# Patient Record
Sex: Male | Born: 1990 | Race: Black or African American | Hispanic: No | Smoking: Current every day smoker
Health system: Southern US, Community
[De-identification: ages and names within clinical notes are randomized; demographics above are authoritative.]

## PROBLEM LIST (undated history)

## (undated) DIAGNOSIS — R0989 Other specified symptoms and signs involving the circulatory and respiratory systems: Secondary | ICD-10-CM

## (undated) DIAGNOSIS — Q211 Atrial septal defect, unspecified: Secondary | ICD-10-CM

## (undated) DIAGNOSIS — S62629A Displaced fracture of medial phalanx of unspecified finger, initial encounter for closed fracture: Secondary | ICD-10-CM

## (undated) DIAGNOSIS — M24419 Recurrent dislocation, unspecified shoulder: Secondary | ICD-10-CM

## (undated) DIAGNOSIS — R011 Cardiac murmur, unspecified: Secondary | ICD-10-CM

## (undated) HISTORY — PX: ASD REPAIR: SHX258

---

## 2011-06-20 ENCOUNTER — Other Ambulatory Visit: Payer: Self-pay | Admitting: Orthopedic Surgery

## 2011-06-23 ENCOUNTER — Encounter (HOSPITAL_BASED_OUTPATIENT_CLINIC_OR_DEPARTMENT_OTHER): Payer: Self-pay | Admitting: *Deleted

## 2011-06-23 NOTE — Pre-Procedure Instructions (Addendum)
Pt. has a permanent lower retainer.  To come for EKG.  Echo and cardiology note req. from  [facility]. Cardiology.

## 2011-06-23 NOTE — Pre-Procedure Instructions (Signed)
To come for EKG 

## 2011-06-24 ENCOUNTER — Encounter (HOSPITAL_BASED_OUTPATIENT_CLINIC_OR_DEPARTMENT_OTHER)
Admission: RE | Admit: 2011-06-24 | Discharge: 2011-06-24 | Disposition: A | Discharge status: Home / Self Care | Payer: PRIVATE HEALTH INSURANCE | Source: Ambulatory Visit | Attending: Orthopedic Surgery | Admitting: Orthopedic Surgery

## 2011-06-24 ENCOUNTER — Other Ambulatory Visit: Payer: Self-pay

## 2011-06-24 NOTE — Pre-Procedure Instructions (Signed)
Echo and cardiology note reviewed by Dr. Chaney Malling, pt. OK for surg.

## 2011-06-24 NOTE — Pre-Procedure Instructions (Signed)
Pt. states has had no problems since ASD repair, no cardiologist, no PCP

## 2011-06-25 ENCOUNTER — Encounter (HOSPITAL_BASED_OUTPATIENT_CLINIC_OR_DEPARTMENT_OTHER): Payer: Self-pay | Admitting: Anesthesiology

## 2011-06-25 ENCOUNTER — Encounter (HOSPITAL_BASED_OUTPATIENT_CLINIC_OR_DEPARTMENT_OTHER): Payer: Self-pay | Admitting: Orthopedic Surgery

## 2011-06-25 ENCOUNTER — Encounter (HOSPITAL_BASED_OUTPATIENT_CLINIC_OR_DEPARTMENT_OTHER)
Admission: RE | Disposition: A | Discharge status: Home / Self Care | Payer: Self-pay | Source: Ambulatory Visit | Attending: Orthopedic Surgery

## 2011-06-25 ENCOUNTER — Ambulatory Visit (HOSPITAL_BASED_OUTPATIENT_CLINIC_OR_DEPARTMENT_OTHER): Payer: PRIVATE HEALTH INSURANCE | Admitting: Anesthesiology

## 2011-06-25 ENCOUNTER — Encounter (HOSPITAL_BASED_OUTPATIENT_CLINIC_OR_DEPARTMENT_OTHER): Payer: Self-pay

## 2011-06-25 ENCOUNTER — Ambulatory Visit (HOSPITAL_BASED_OUTPATIENT_CLINIC_OR_DEPARTMENT_OTHER)
Admission: RE | Admit: 2011-06-25 | Discharge: 2011-06-25 | Disposition: A | Discharge status: Home / Self Care | Payer: PRIVATE HEALTH INSURANCE | Source: Ambulatory Visit | Attending: Orthopedic Surgery | Admitting: Orthopedic Surgery

## 2011-06-25 DIAGNOSIS — X500XXA Overexertion from strenuous movement or load, initial encounter: Secondary | ICD-10-CM | POA: Insufficient documentation

## 2011-06-25 DIAGNOSIS — Y92838 Other recreation area as the place of occurrence of the external cause: Secondary | ICD-10-CM | POA: Insufficient documentation

## 2011-06-25 DIAGNOSIS — Y9362 Activity, american flag or touch football: Secondary | ICD-10-CM | POA: Insufficient documentation

## 2011-06-25 DIAGNOSIS — Z01810 Encounter for preprocedural cardiovascular examination: Secondary | ICD-10-CM | POA: Insufficient documentation

## 2011-06-25 DIAGNOSIS — Y9239 Other specified sports and athletic area as the place of occurrence of the external cause: Secondary | ICD-10-CM | POA: Insufficient documentation

## 2011-06-25 DIAGNOSIS — IMO0002 Reserved for concepts with insufficient information to code with codable children: Secondary | ICD-10-CM | POA: Insufficient documentation

## 2011-06-25 HISTORY — DX: Cardiac murmur, unspecified: R01.1

## 2011-06-25 HISTORY — DX: Other specified symptoms and signs involving the circulatory and respiratory systems: R09.89

## 2011-06-25 HISTORY — DX: Atrial septal defect: Q21.1

## 2011-06-25 HISTORY — PX: ORIF FINGER FRACTURE: SHX2122

## 2011-06-25 HISTORY — DX: Atrial septal defect, unspecified: Q21.10

## 2011-06-25 HISTORY — DX: Displaced fracture of middle phalanx of unspecified finger, initial encounter for closed fracture: S62.629A

## 2011-06-25 SURGERY — OPEN REDUCTION INTERNAL FIXATION (ORIF) METACARPAL (FINGER) FRACTURE
Anesthesia: General | Site: Finger | Laterality: Right | Wound class: Clean

## 2011-06-25 MED ORDER — FENTANYL CITRATE 0.05 MG/ML IJ SOLN: 25.0000 ug | INTRAMUSCULAR | Status: DC | PRN

## 2011-06-25 MED ORDER — FENTANYL CITRATE 0.05 MG/ML IJ SOLN
50.0000 ug | INTRAMUSCULAR | Status: DC | PRN
  Administered 2011-06-25: 100 ug via INTRAVENOUS

## 2011-06-25 MED ORDER — MIDAZOLAM HCL 2 MG/2ML IJ SOLN: 1.0000 mg | INTRAMUSCULAR | Status: DC | PRN

## 2011-06-25 MED ORDER — IBUPROFEN 200 MG PO TABS: 200.0000 mg | ORAL_TABLET | Freq: Four times a day (QID) | ORAL | Status: DC | PRN

## 2011-06-25 MED ORDER — LIDOCAINE HCL (CARDIAC) 20 MG/ML IV SOLN
INTRAVENOUS | Status: DC | PRN
  Administered 2011-06-25: 40 mg via INTRAVENOUS

## 2011-06-25 MED ORDER — MIDAZOLAM HCL 2 MG/2ML IJ SOLN
0.5000 mg | INTRAMUSCULAR | Status: DC | PRN
  Administered 2011-06-25: 2 mg via INTRAVENOUS

## 2011-06-25 MED ORDER — FENTANYL CITRATE 0.05 MG/ML IJ SOLN: 50.0000 ug | INTRAMUSCULAR | Status: DC | PRN

## 2011-06-25 MED ORDER — OXYCODONE-ACETAMINOPHEN 5-325 MG PO TABS: 1.0000 | ORAL_TABLET | ORAL | Status: AC | PRN

## 2011-06-25 MED ORDER — CHLORHEXIDINE GLUCONATE 4 % EX LIQD: 60.0000 mL | Freq: Once | CUTANEOUS | Status: DC

## 2011-06-25 MED ORDER — KETOROLAC TROMETHAMINE 30 MG/ML IJ SOLN: 15.0000 mg | Freq: Once | INTRAMUSCULAR | Status: DC | PRN

## 2011-06-25 MED ORDER — LACTATED RINGERS IV SOLN
INTRAVENOUS | Status: DC
  Administered 2011-06-25: 09:00:00 via INTRAVENOUS

## 2011-06-25 MED ORDER — ROPIVACAINE HCL 5 MG/ML IJ SOLN
INTRAMUSCULAR | Status: DC | PRN
  Administered 2011-06-25: 30 mL via EPIDURAL

## 2011-06-25 MED ORDER — PROPOFOL 10 MG/ML IV EMUL
INTRAVENOUS | Status: DC | PRN
  Administered 2011-06-25: 200 mg via INTRAVENOUS

## 2011-06-25 MED ORDER — LIDOCAINE HCL 1 % IJ SOLN
INTRAMUSCULAR | Status: DC | PRN
  Administered 2011-06-25: 1 mL via INTRADERMAL

## 2011-06-25 MED ORDER — LACTATED RINGERS IV SOLN
INTRAVENOUS | Status: DC
  Administered 2011-06-25 (×2): via INTRAVENOUS

## 2011-06-25 MED ORDER — LACTATED RINGERS IV SOLN: 500.0000 mL | INTRAVENOUS | Status: DC

## 2011-06-25 MED ORDER — ONDANSETRON HCL 4 MG/2ML IJ SOLN
INTRAMUSCULAR | Status: DC | PRN
  Administered 2011-06-25: 4 mg via INTRAVENOUS

## 2011-06-25 MED ORDER — MIDAZOLAM HCL 2 MG/2ML IJ SOLN
1.0000 mg | INTRAMUSCULAR | Status: DC | PRN
  Administered 2011-06-25: 2 mg via INTRAVENOUS

## 2011-06-25 MED ORDER — OXYMETAZOLINE HCL 0.05 % NA SOLN: 2.0000 | Freq: Once | NASAL | Status: DC

## 2011-06-25 MED ORDER — CEFAZOLIN SODIUM 1-5 GM-% IV SOLN
1.0000 g | INTRAVENOUS | Status: AC
  Administered 2011-06-25: 2 g via INTRAVENOUS

## 2011-06-25 MED ORDER — METOCLOPRAMIDE HCL 5 MG/ML IJ SOLN: 10.0000 mg | Freq: Once | INTRAMUSCULAR | Status: DC | PRN

## 2011-06-25 MED ORDER — LIDOCAINE-PRILOCAINE 2.5-2.5 % EX CREA: 1.0000 "application " | TOPICAL_CREAM | Freq: Once | CUTANEOUS | Status: DC

## 2011-06-25 MED ORDER — DEXAMETHASONE SODIUM PHOSPHATE 10 MG/ML IJ SOLN
INTRAMUSCULAR | Status: DC | PRN
  Administered 2011-06-25: 10 mg via INTRAVENOUS

## 2011-06-25 MED ORDER — CEFAZOLIN SODIUM 1-5 GM-% IV SOLN: 1.0000 g | INTRAVENOUS | Status: DC

## 2011-06-25 MED ORDER — MORPHINE SULFATE 2 MG/ML IJ SOLN: 0.0500 mg/kg | INTRAMUSCULAR | Status: DC | PRN

## 2011-06-25 SURGICAL SUPPLY — 52 items
5300428 ×2 IMPLANT
BANDAGE GAUZE ELAST BULKY 4 IN (GAUZE/BANDAGES/DRESSINGS) ×2 IMPLANT
BLADE MINI RND TIP GREEN BEAV (BLADE) ×2 IMPLANT
BLADE SURG 15 STRL LF DISP TIS (BLADE) ×1 IMPLANT
BLADE SURG 15 STRL SS (BLADE) ×1
BNDG COHESIVE 3X5 TAN STRL LF (GAUZE/BANDAGES/DRESSINGS) ×4 IMPLANT
BNDG ESMARK 4X9 LF (GAUZE/BANDAGES/DRESSINGS) ×2 IMPLANT
CHLORAPREP W/TINT 26ML (MISCELLANEOUS) ×2 IMPLANT
CLOTH BEACON ORANGE TIMEOUT ST (SAFETY) ×2 IMPLANT
CORDS BIPOLAR (ELECTRODE) ×2 IMPLANT
COVER MAYO STAND STRL (DRAPES) ×2 IMPLANT
COVER TABLE BACK 60X90 (DRAPES) ×2 IMPLANT
CUFF TOURNIQUET SINGLE 18IN (TOURNIQUET CUFF) IMPLANT
DECANTER SPIKE VIAL GLASS SM (MISCELLANEOUS) IMPLANT
DRAPE EXTREMITY T 121X128X90 (DRAPE) ×2 IMPLANT
DRAPE OEC MINIVIEW 54X84 (DRAPES) ×2 IMPLANT
DRAPE SURG 17X23 STRL (DRAPES) ×2 IMPLANT
GAUZE XEROFORM 1X8 LF (GAUZE/BANDAGES/DRESSINGS) ×2 IMPLANT
GLOVE BIO SURGEON STRL SZ 6.5 (GLOVE) IMPLANT
GLOVE BIOGEL M STRL SZ7.5 (GLOVE) ×2 IMPLANT
GLOVE BIOGEL PI IND STRL 8.5 (GLOVE) ×1 IMPLANT
GLOVE BIOGEL PI INDICATOR 8.5 (GLOVE) ×1
GLOVE INDICATOR 8.0 STRL GRN (GLOVE) ×2 IMPLANT
GLOVE SURG ORTHO 8.0 STRL STRW (GLOVE) ×2 IMPLANT
GOWN BRE IMP PREV XXLGXLNG (GOWN DISPOSABLE) ×2 IMPLANT
GOWN PREVENTION PLUS XLARGE (GOWN DISPOSABLE) IMPLANT
NEEDLE 27GAX1X1/2 (NEEDLE) IMPLANT
NS IRRIG 1000ML POUR BTL (IV SOLUTION) ×2 IMPLANT
PACK BASIN DAY SURGERY FS (CUSTOM PROCEDURE TRAY) ×2 IMPLANT
PAD CAST 3X4 CTTN HI CHSV (CAST SUPPLIES) ×1 IMPLANT
PADDING CAST ABS 4INX4YD NS (CAST SUPPLIES) ×1
PADDING CAST ABS COTTON 4X4 ST (CAST SUPPLIES) ×1 IMPLANT
PADDING CAST COTTON 3X4 STRL (CAST SUPPLIES) ×1
PADDING WEBRIL 3 STERILE (GAUZE/BANDAGES/DRESSINGS) ×2 IMPLANT
SCREW SELF TAP CORTEX 1.3 10MM (Screw) ×8 IMPLANT
SLEEVE SCD COMPRESS KNEE MED (MISCELLANEOUS) IMPLANT
SPLINT PLASTER CAST XFAST 3X15 (CAST SUPPLIES) ×11 IMPLANT
SPLINT PLASTER XTRA FASTSET 3X (CAST SUPPLIES) ×11
SPONGE GAUZE 4X4 12PLY (GAUZE/BANDAGES/DRESSINGS) ×2 IMPLANT
SPONGE GAUZE 4X4 FOR O.R. (GAUZE/BANDAGES/DRESSINGS) ×2 IMPLANT
STOCKINETTE 4X48 STRL (DRAPES) ×2 IMPLANT
SUT CHROMIC 5 0 P 3 (SUTURE) IMPLANT
SUT CHROMIC 6 0 PS 4 (SUTURE) ×2 IMPLANT
SUT MERSILENE 4 0 P 3 (SUTURE) IMPLANT
SUT VICRYL 4-0 PS2 18IN ABS (SUTURE) IMPLANT
SUT VICRYL RAPID 5 0 P 3 (SUTURE) IMPLANT
SUT VICRYL RAPIDE 4/0 PS 2 (SUTURE) ×2 IMPLANT
SYR BULB 3OZ (MISCELLANEOUS) ×2 IMPLANT
SYR CONTROL 10ML LL (SYRINGE) IMPLANT
TOWEL OR 17X24 6PK STRL BLUE (TOWEL DISPOSABLE) ×2 IMPLANT
UNDERPAD 30X30 INCONTINENT (UNDERPADS AND DIAPERS) ×2 IMPLANT
WATER STERILE IRR 1000ML POUR (IV SOLUTION) IMPLANT

## 2011-06-25 NOTE — Anesthesia Postprocedure Evaluation (Signed)
Anesthesia Post Note  Patient: Christopher [name]  Procedure(s) Performed:  OPEN REDUCTION INTERNAL FIXATION (ORIF) METACARPAL (FINGER) FRACTURE - right ring middle phalanx   Anesthesia type: General  Patient location: PACU  Post pain: Pain level controlled  Post assessment: Patient's Cardiovascular Status Stable  Last Vitals:  Filed Vitals:   06/25/11 1223  BP: 125/71  Pulse: 70  Temp: 36.5 C  Resp: 16    Post vital signs: Reviewed and stable  Level of consciousness: sedated  Complications: No apparent anesthesia complications

## 2011-06-25 NOTE — Brief Op Note (Signed)
06/25/2011  11:00 AM  PATIENT:  Christopher [name]  20 y.o. male  PRE-OPERATIVE DIAGNOSIS:  fracture middle phalanx right ring finger  POST-OPERATIVE DIAGNOSIS:  same  PROCEDURE:  Procedure(s): OPEN REDUCTION INTERNAL FIXATION (ORIF) METACARPAL (FINGER) FRACTURE  SURGEON:  Surgeon(s): Nicki Reaper, MD  PHYSICIAN ASSISTANT:   ASSISTANTS: none   ANESTHESIA:   regional and general  EBL:  Total I/O In: 1300 [I.V.:1300] Out: -   BLOOD ADMINISTERED:none  DRAINS: none   LOCAL MEDICATIONS USED:  NONE  SPECIMEN:  No Specimen  DISPOSITION OF SPECIMEN:  N/A  [name]:  YES  TOURNIQUET:   Total Tourniquet Time Documented: Upper Arm (Right) - 36 minutes  DICTATION: .Other Dictation: Dictation Number [id number]  PLAN OF CARE: Discharge to home after PACU  PATIENT DISPOSITION:  PACU - hemodynamically stable.

## 2011-06-25 NOTE — Op Note (Signed)
Dictation [id number]

## 2011-06-25 NOTE — Op Note (Signed)
NAME:  Christopher [name], Christopher [name]                    ACCOUNT NO.:  MEDICAL RECORD NO.:  [id number]  LOCATION:                                 FACILITY:  PHYSICIAN:  Cindee Salt, M.D.            DATE OF BIRTH:  DATE OF PROCEDURE:  06/25/2011 DATE OF DISCHARGE:                              OPERATIVE REPORT   PREOPERATIVE DIAGNOSIS:  Fracture middle phalanx, right ring finger.  POSTOPERATIVE DIAGNOSIS:  Fracture middle phalanx, right ring finger.  OPERATION:  Open reduction and internal fixation, right ring finger, middle phalanx.  SURGEON:  Cindee Salt, MD  ANESTHESIA:  Interscalene block with general.  PROCEDURE:  The patient was brought to the operating room where an interscalene block general anesthetic was carried out without difficulty.  He was prepped using ChloraPrep, supine position, right arm free.  A 3-minute dry time was allowed.  Time-out taken, confirming the patient and procedure.  The limb was exsanguinated with an Esmarch bandage.  Tourniquet placed high on the arm, was inflated to 250 mmHg. A midlateral incision was made over the radial aspect of the ring finger right hand, carried over the dorsal aspect of the distal phalangeal joint, carried down through subcutaneous tissue.  Bleeders were electrocauterized with bipolar.  The extensor tendon was mobilized.  An incision made in the periosteum to the radial aspect of the extensor tendon.  The fracture was noted to be comminuted, this was isolated, was not found to be significantly healed.  Granulation tissue was removed. This was reduced and pinned with 2A K-wires with anticipation of replacing these with screws.  Two 1.3 mm screws were then inserted after drilling holes through the radial cortex and the ulnar cortex after image intensification revealed that the fracture was well-aligned both AP and lateral directions.  Four 10-mm screws were then inserted, 2 prior to removal of the 2A K-wires.  Each 2A K-wire was  removed sequentially with drilling this with the 1-mm drill bit.  The x-rays confirmed anatomic position in the AP and lateral direction.  There was no angulation or overlap to the finger with full flexion/extension.  The wound was copiously irrigated with saline.  The periosteum closed with running 6-0 chromic sutures and the skin with interrupted 4-0 Vicryl rapide sutures.  Sterile compressive dressing and dorsal palmar splint to the hand was applied.  On deflation of the tourniquet, all fingers immediately pinked.  He was taken to the recovery room for observation in satisfactory condition.          ______________________________ Cindee Salt, M.D.     GK/MEDQ  D:  06/25/2011  T:  06/25/2011  Job:  [id number]

## 2011-06-25 NOTE — Transfer of Care (Signed)
Immediate Anesthesia Transfer of Care Note  Patient: Christopher [name]  Procedure(s) Performed:  OPEN REDUCTION INTERNAL FIXATION (ORIF) METACARPAL (FINGER) FRACTURE - right ring middle phalanx   Patient Location: PACU  Anesthesia Type: General and Regional  Level of Consciousness: awake, sedated and patient cooperative  Airway & Oxygen Therapy: Patient Spontanous Breathing and Patient connected to face mask oxygen  Post-op Assessment: Report given to PACU RN, Post -op Vital signs reviewed and stable and Patient moving all extremities  Post vital signs: Reviewed and stable  Complications: No apparent anesthesia complications

## 2011-06-25 NOTE — Anesthesia Procedure Notes (Addendum)
Anesthesia Regional Block:  Supraclavicular block  Pre-Anesthetic Checklist: ,, timeout performed, Correct Patient, Correct Site, Correct Laterality, Correct Procedure, Correct Position, site marked, Risks and benefits discussed,  Surgical consent,  Pre-op evaluation,  At surgeon's request and post-op pain management  Laterality: Right  Prep: chloraprep       Needles:   Needle Type: Other   (Arrow Echogenic)   Needle Length: 9cm  Needle Gauge: 21    Additional Needles:  Procedures: ultrasound guided Supraclavicular block Narrative:  Start time: 06/25/2011 10:41 AM End time: 06/25/2011 10:48 AM Injection made incrementally with aspirations every 5 mL.  Performed by: Personally  Anesthesiologist: cfrederick  Additional Notes: Ultrasound guidance used to: id relevant anatomy, confirm needle position, local anesthetic spread, avoidance of vascular puncture. Picture saved. No complications.    Supraclavicular block Procedure Name: LMA Insertion Date/Time: 06/25/2011 10:12 AM Performed by: Maudy Yonan D Pre-anesthesia Checklist: Patient identified, Emergency Drugs available, Suction available and Patient being monitored Patient Re-evaluated:Patient Re-evaluated prior to inductionOxygen Delivery Method: Circle System Utilized Preoxygenation: Pre-oxygenation with 100% oxygen Intubation Type: IV induction Ventilation: Mask ventilation without difficulty LMA: LMA with gastric port inserted LMA Size: 4.0 Number of attempts: 1 Placement Confirmation: positive ETCO2 Tube secured with: Tape Dental Injury: Teeth and Oropharynx as per pre-operative assessment

## 2011-06-25 NOTE — H&P (Signed)
Christopher [name] is a 20 year old right hand dominant male referred by Dr. Ashley Jacobs for a consultation with respect to an injury to his right ring finger. This occurred while playing flag football on 06-09-11 when he dove for a flag and ended up getting caught in the player's shorts rather than grabbing the flag. He complains of constant, moderate, aching pain. He was placed in a splint. He has swelling.  X-rays reveal a fracture of his middle phalanx, long oblique in nature up to the level of the joint.   He states that he has been taking Tylenol. He is a Consulting civil engineer at [employer]. He has no history of injuries. No history of diabetes, thyroid problems, arthritis or gout.   Repeat x-rays reveals further displacement compared with the original x-rays.  Diagnosis: Oblique fracture middle phalanx right ring finger.  We have discussed with him the possibility of continued treatment vs ORIF. He would like to have this fixed. The pre, peri and post op course are discussed along with risks and complications.  He is aware there is no guarantee with surgery, possibility of infection, recurrence, injury to arteries, nerves and tendons, incomplete relief of symptoms, nonunion and dystrophy.  This will be scheduled for ORIF ring finger as an outpatient [facility]. Christopher [name] is an 20 y.o. male.   Chief Complaint: fracture right ring finger HPI: see above  Past Medical History  Diagnosis Date  . Atrial septal defect     repaired 5 yrs. ago  . Heart murmur     ASD - s/p repair  . Runny nose     clear drainage  . Fracture of finger, middle phalanx, closed     right ring; injured 06/09/11 while playing flag football    Past Surgical History  Procedure Date  . Asd repair 5 yrs. ago    [facility]    History reviewed. No pertinent family history. Social History:  reports that he has been smoking Cigarettes.  He has smoked for the past 2 years. He has never used smokeless tobacco. He reports that he drinks alcohol.  He reports that he does not use illicit drugs.  Allergies: No Known Allergies  Medications Prior to Admission  Medication Dose Route Frequency Provider Last Rate Last Dose  . ceFAZolin (ANCEF) IVPB 1 g/50 mL premix  1 g Intravenous 60 min Pre-Op       . chlorhexidine (HIBICLENS) 4 % liquid 4 application  60 mL Topical Once       . fentaNYL (SUBLIMAZE) injection 50-100 mcg  50-100 mcg Intravenous PRN Constance Goltz, MD   100 mcg at 06/25/11 [phone]  . fentaNYL (SUBLIMAZE) injection 50-100 mcg  50-100 mcg Intravenous PRN Constance Goltz, MD      . lactated ringers infusion 500 mL  500 mL Intravenous Continuous Constance Goltz, MD      . lactated ringers infusion   Intravenous Continuous Germaine Pomfret, MD 20 mL/hr at 06/25/11 [phone]    . lactated ringers infusion   Intravenous Continuous Constance Goltz, MD      . lidocaine-prilocaine (EMLA) cream 1 application  1 application Topical Once Constance Goltz, MD      . midazolam (VERSED) injection 0.5-2 mg  0.5-2 mg Intravenous PRN Constance Goltz, MD   2 mg at 06/25/11 5284  . midazolam (VERSED) injection 1-2 mg  1-2 mg Intravenous PRN Constance Goltz, MD   2 mg at 06/25/11 0939  . midazolam (VERSED) injection  1-2 mg  1-2 mg Intravenous PRN Constance Goltz, MD      . oxymetazoline (AFRIN) 0.05 % nasal spray 2 spray  2 spray Each Nare Once Constance Goltz, MD       No current outpatient prescriptions on file as of 06/25/2011.    No results found for this or any previous visit (from the past 48 hour(s)).  No results found.  ROS: neg    Blood pressure 138/77, pulse 72, temperature 98.5 F (36.9 C), temperature source Oral, resp. rate 12, height 6\' 4"  (1.93 m), weight 97.523 kg (215 lb), SpO2 100.00%.  General appearance: alert, cooperative and appears stated age Head: Normocephalic, without obvious abnormality Neck: no adenopathy Resp: clear to auscultation  bilaterally Cardio: regular rate and rhythm, S1, S2 normal, no murmur, click, rub or gallop GI: soft, non-tender; bowel sounds normal; no masses,  no organomegaly Extremities: extremities normal, atraumatic, no cyanosis or edema Pulses: 2+ and symmetric Skin: Skin color, texture, turgor normal. No rashes or lesions Neurologic: Grossly normal Incision/Wound: na  Assessment/Plan orif rrf  Mylan Lengyel R 06/25/2011, 9:53 AM

## 2011-06-25 NOTE — Anesthesia Preprocedure Evaluation (Addendum)
Anesthesia Evaluation  Patient identified by MRN, date of birth, ID band Patient awake    Reviewed: Allergy & Precautions, H&P , NPO status , Patient's Chart, lab work & pertinent test results, reviewed documented beta [name] date and time   Airway Mallampati: II TM Distance: >3 FB Neck ROM: full    Dental No notable dental hx.    Pulmonary neg pulmonary ROS,    Pulmonary exam normal       Cardiovascular + Valvular Problems/Murmurs     Neuro/Psych Negative Neurological ROS  Negative Psych ROS   GI/Hepatic negative GI ROS, Neg liver ROS,   Endo/Other  Negative Endocrine ROS  Renal/GU negative Renal ROS  Genitourinary negative   Musculoskeletal   Abdominal   Peds  Hematology negative hematology ROS (+)   Anesthesia Other Findings See surgeon's H&P   Reproductive/Obstetrics negative OB ROS                           Anesthesia Physical Anesthesia Plan  ASA: III  Anesthesia Plan: General   Post-op Pain Management: MAC Combined w/ Regional for Post-op pain   Induction: Intravenous  Airway Management Planned: LMA  Additional Equipment:   Intra-op Plan:   Post-operative Plan: Extubation in OR  Informed Consent: I have reviewed the patients History and Physical, chart, labs and discussed the procedure including the risks, benefits and alternatives for the proposed anesthesia with the patient or authorized representative who has indicated his/her understanding and acceptance.   Dental advisory given  Plan Discussed with: CRNA and Surgeon  Anesthesia Plan Comments:      Anesthesia Quick Evaluation

## 2011-07-02 ENCOUNTER — Encounter (HOSPITAL_BASED_OUTPATIENT_CLINIC_OR_DEPARTMENT_OTHER): Payer: Self-pay | Admitting: Orthopedic Surgery

## 2011-10-29 ENCOUNTER — Emergency Department (HOSPITAL_COMMUNITY)
Admission: EM | Admit: 2011-10-29 | Discharge: 2011-10-30 | Disposition: A | Discharge status: Home / Self Care | Payer: Self-pay | Attending: Emergency Medicine | Admitting: Emergency Medicine

## 2011-10-29 ENCOUNTER — Encounter (HOSPITAL_COMMUNITY): Payer: Self-pay | Admitting: Emergency Medicine

## 2011-10-29 DIAGNOSIS — M21829 Other specified acquired deformities of unspecified upper arm: Secondary | ICD-10-CM

## 2011-10-29 DIAGNOSIS — M2180 Other specified acquired deformities of unspecified limb: Secondary | ICD-10-CM | POA: Insufficient documentation

## 2011-10-29 DIAGNOSIS — Y9362 Activity, american flag or touch football: Secondary | ICD-10-CM | POA: Insufficient documentation

## 2011-10-29 DIAGNOSIS — S43016A Anterior dislocation of unspecified humerus, initial encounter: Secondary | ICD-10-CM | POA: Insufficient documentation

## 2011-10-29 DIAGNOSIS — W19XXXA Unspecified fall, initial encounter: Secondary | ICD-10-CM | POA: Insufficient documentation

## 2011-10-29 NOTE — ED Notes (Signed)
Pt states he was playing flag football and he jumped up to catch a ball and when he came down he states he fell landing funny on his right shoulder

## 2011-10-30 ENCOUNTER — Emergency Department (HOSPITAL_COMMUNITY): Payer: Self-pay

## 2011-10-30 MED ORDER — PROPOFOL 10 MG/ML IV EMUL
INTRAVENOUS | Status: AC
  Filled 2011-10-30: qty 20

## 2011-10-30 MED ORDER — ONDANSETRON HCL 4 MG/2ML IJ SOLN
INTRAMUSCULAR | Status: AC
  Administered 2011-10-30: 4 mg
  Filled 2011-10-30: qty 2

## 2011-10-30 MED ORDER — PROPOFOL 10 MG/ML IV BOLUS
INTRAVENOUS | Status: AC | PRN
  Administered 2011-10-30: 50 mg via INTRAVENOUS
  Administered 2011-10-30 (×2): 25 mg via INTRAVENOUS

## 2011-10-30 MED ORDER — HYDROCODONE-ACETAMINOPHEN 5-500 MG PO TABS: 1.0000 | ORAL_TABLET | Freq: Four times a day (QID) | ORAL | Status: AC | PRN

## 2011-10-30 NOTE — ED Provider Notes (Signed)
History     CSN: [id number]  Arrival date & time 10/29/11  2324   First MD Initiated Contact with Patient 10/30/11 [phone]      Chief Complaint  Patient presents with  . Shoulder Injury    (Consider location/radiation/quality/duration/timing/severity/associated sxs/prior treatment) Patient is a 21 y.o. male presenting with shoulder injury. The history is provided by the patient. No language interpreter was used.  Shoulder Injury This is a new problem. The current episode started 1 to 2 hours ago. The problem occurs constantly. The problem has not changed since onset.Pertinent negatives include no chest pain, no abdominal pain, no headaches and no shortness of breath. The symptoms are aggravated by nothing. He has tried nothing for the symptoms. The treatment provided no relief.  10/10 pain sharp states it is not in the correct position since coming down on the right shoulder playing football.    Past Medical History  Diagnosis Date  . Atrial septal defect     repaired 5 yrs. ago  . Heart murmur     ASD - s/p repair  . Runny nose     clear drainage  . Fracture of finger, middle phalanx, closed     right ring; injured 06/09/11 while playing flag football    Past Surgical History  Procedure Date  . Asd repair 5 yrs. ago    [facility]  . Orif finger fracture 06/25/2011    Procedure: OPEN REDUCTION INTERNAL FIXATION (ORIF) METACARPAL (FINGER) FRACTURE;  Surgeon: Nicki Reaper, MD;  Location: [facility];  Service: Orthopedics;  Laterality: Right;  right ring middle phalanx     Family History  Problem Relation Age of Onset  . Hypertension Other     History  Substance Use Topics  . Smoking status: Current Everyday Smoker -- 2 years    Types: Cigarettes  . Smokeless tobacco: Never Used   Comment: 1 pack every 3 days  . Alcohol Use: Yes     once every 2 weeks      Review of Systems  Constitutional: Negative.   HENT: Negative.   Eyes: Negative.   Respiratory:  Negative for shortness of breath.   Cardiovascular: Negative for chest pain.  Gastrointestinal: Negative.  Negative for abdominal pain.  Genitourinary: Negative.   Musculoskeletal: Negative.   Skin: Negative.   Neurological: Negative for headaches.  Hematological: Negative.   Psychiatric/Behavioral: Negative.     Allergies  Review of patient's allergies indicates no known allergies.  Home Medications   Current Outpatient Rx  Name Route Sig Dispense Refill  . HYDROCODONE-ACETAMINOPHEN 5-500 MG PO TABS Oral Take 1 tablet by mouth every 6 (six) hours as needed for pain. 10 tablet 0    BP 153/87  Pulse 68  Temp(Src) 97.8 F (36.6 C) (Oral)  Resp 16  SpO2 100%  Physical Exam  Constitutional: He is oriented to person, place, and time. He appears well-developed and well-nourished. No distress.  HENT:  Head: Normocephalic and atraumatic.  Mouth/Throat: Oropharynx is clear and moist.  Eyes: Conjunctivae and EOM are normal. Pupils are equal, round, and reactive to light.  Neck: Normal range of motion. Neck supple.  Cardiovascular: Normal rate and regular rhythm.   Pulmonary/Chest: Effort normal and breath sounds normal.  Abdominal: Soft. Bowel sounds are normal.  Musculoskeletal: He exhibits tenderness.       Right shoulder visibly deformed no snuff box tenderness intact bicep tendon intact pronation and supination right radial pulse 2+ FROm of the right fingers cap refill  to right fingers < 2 sec  Neurological: He is alert and oriented to person, place, and time. He has normal reflexes.  Skin: Skin is warm and dry.  Psychiatric: He has a normal mood and affect.    ED Course  ORTHOPEDIC INJURY TREATMENT Date/Time: 10/30/2011 1:55 AM Performed by: Jasmine Awe Authorized by: Jasmine Awe Consent: Verbal consent obtained. Written consent obtained. Risks and benefits: risks, benefits and alternatives were discussed Consent given by: patient Patient  understanding: patient states understanding of the procedure being performed Patient consent: the patient's understanding of the procedure matches consent given Procedure consent: procedure consent matches procedure scheduled Relevant documents: relevant documents present and verified Site marked: the operative site was marked Imaging studies: imaging studies available Patient identity confirmed: verbally with patient and arm band Injury location: shoulder Location details: right shoulder Injury type: dislocation Dislocation type: anterior Hill-Sachs deformity: yes Chronicity: new Pre-procedure neurovascular assessment: neurovascularly intact Pre-procedure distal perfusion: normal Pre-procedure neurological function: normal Pre-procedure range of motion: reduced Local anesthesia used: no Patient sedated: yes Sedation type: moderate (conscious) sedation Sedatives: propofol Manipulation performed: yes Reduction method: external rotation, scapular manipulation and traction and counter traction Reduction successful: yes X-ray confirmed reduction: yes Immobilization: sling Post-procedure neurovascular assessment: post-procedure neurovascularly intact Post-procedure distal perfusion: normal Post-procedure neurological function: normal Post-procedure range of motion: normal Patient tolerance: Patient tolerated the procedure well with no immediate complications.   (including critical care time)  Labs Reviewed - No data to display Dg Shoulder Right  10/30/2011  *RADIOLOGY REPORT*  Clinical Data: Injured right shoulder while playing football, with obvious deformity.  RIGHT SHOULDER - 2+ VIEW  Comparison: None.  Findings: There is anterior dislocation of the right humeral head. No definite Hill-Sachs or osseous Bankart lesion is identified, though there is question of slight irregularity along the inferior glenoid.  The right acromioclavicular joint is unremarkable in appearance. The  visualized portions of the right lung are grossly clear.  No significant soft tissue abnormalities are characterized on radiograph.  IMPRESSION: Anterior dislocation of the right humeral head; no definite Hill- Sachs or osseous Bankart lesion seen, though there is question of slight irregularity along the inferior glenoid.  Original Report Authenticated By: Tonia Ghent, M.D.   Dg Shoulder Right Port  10/30/2011  *RADIOLOGY REPORT*  Clinical Data: Status post reduction of right shoulder dislocation.  PORTABLE RIGHT SHOULDER - 2+ VIEW  Comparison: None.  Findings: There is been successful interval reduction of the right humeral head dislocation.  An apparent Hill-Sachs deformity is suggested.  No osseous Bankart lesion is seen.  The right acromioclavicular joint is unremarkable in appearance. The visualized portions of the right lung are clear.  No significant soft tissue abnormalities are characterized on radiograph.  IMPRESSION: Successful reduction of right humeral head dislocation; apparent Hill-Sachs deformity suggested.  No osseous Bankart lesion seen.  Original Report Authenticated By: Tonia Ghent, M.D.     1. Hill Sachs deformity   2. Anterior shoulder dislocation      Conscious sedation also performed by me with respiratory and 2 nurses present MDM  Patient informed of hill sachs deformity and need to follow up with orthopedics for ongoing care.  Patient verbalizes understanding and agrees to follow up       Mickle Campton Smitty Cords, MD 10/30/11 [phone]

## 2011-10-30 NOTE — ED Notes (Signed)
Patient given discharge instructions, information, prescriptions, and diet order. Patient states that they adequately understand discharge information given and to return to ED if symptoms return or worsen.    Pt given information about taking medication and drinking together. Patient given 1 rx.

## 2011-10-30 NOTE — Discharge Instructions (Signed)

## 2012-03-24 ENCOUNTER — Emergency Department (HOSPITAL_COMMUNITY): Payer: Self-pay

## 2012-03-24 ENCOUNTER — Emergency Department (HOSPITAL_COMMUNITY)
Admission: EM | Admit: 2012-03-24 | Discharge: 2012-03-24 | Disposition: A | Discharge status: Home / Self Care | Payer: Self-pay | Attending: Emergency Medicine | Admitting: Emergency Medicine

## 2012-03-24 DIAGNOSIS — S43016A Anterior dislocation of unspecified humerus, initial encounter: Secondary | ICD-10-CM | POA: Insufficient documentation

## 2012-03-24 DIAGNOSIS — F17200 Nicotine dependence, unspecified, uncomplicated: Secondary | ICD-10-CM | POA: Insufficient documentation

## 2012-03-24 DIAGNOSIS — Y9367 Activity, basketball: Secondary | ICD-10-CM | POA: Insufficient documentation

## 2012-03-24 DIAGNOSIS — X58XXXA Exposure to other specified factors, initial encounter: Secondary | ICD-10-CM | POA: Insufficient documentation

## 2012-03-24 MED ORDER — SODIUM CHLORIDE 0.9 % IV SOLN
Freq: Once | INTRAVENOUS | Status: AC
  Administered 2012-03-24: 20:00:00 via INTRAVENOUS

## 2012-03-24 MED ORDER — FENTANYL CITRATE 0.05 MG/ML IJ SOLN
50.0000 ug | Freq: Once | INTRAMUSCULAR | Status: AC
  Administered 2012-03-24: 50 ug via INTRAVENOUS
  Filled 2012-03-24: qty 2

## 2012-03-24 MED ORDER — IBUPROFEN 600 MG PO TABS: 600.0000 mg | ORAL_TABLET | Freq: Four times a day (QID) | ORAL | Status: AC | PRN

## 2012-03-24 MED ORDER — PROPOFOL 10 MG/ML IV EMUL
INTRAVENOUS | Status: AC | PRN
  Administered 2012-03-24: 10 mL via INTRAVENOUS
  Administered 2012-03-24: 100 mL via INTRAVENOUS

## 2012-03-24 MED ORDER — PROPOFOL 10 MG/ML IV BOLUS
100.0000 mg | Freq: Once | INTRAVENOUS | Status: AC
  Filled 2012-03-24: qty 1

## 2012-03-24 NOTE — ED Notes (Signed)
Pt  tolerated procedure well.

## 2012-03-24 NOTE — ED Notes (Signed)
WGN:FA21<HY> Expected date:03/24/12<BR> Expected time: 7:32 PM<BR> Means of arrival:<BR> Comments:<BR> Dislocated shoulder

## 2012-03-24 NOTE — ED Notes (Signed)
Brought in by EMS with c/o right shoulder pain after his fall during basketball game.  Per pt, he fell backwards and landed on his right side--- noted right shoulder deformity, swelling and pain.  Pt was given Fentanyl IV en route to ED; pt's pain level on arrival=4/10.

## 2012-03-24 NOTE — ED Notes (Signed)
Ortho tech at bedside to place shoulder immobilizer on.

## 2012-03-24 NOTE — ED Provider Notes (Addendum)
History     CSN: [id number]  Arrival date & time 03/24/12  1933   First MD Initiated Contact with Patient 03/24/12 2007      Chief Complaint  Patient presents with  . Fall  . Shoulder Pain    (Consider location/radiation/quality/duration/timing/severity/associated sxs/prior treatment) HPI Christopher [name] is a 21 y.o. with PMH of prior right anterior shoulder dislocation and also had ASD repair about 6 years ago discovered on routine physical exam.  Presents today with should problem and pain, now 5/10, sharp, worse with any movement, patient has good sensation in right hand and forearm and denies numbness or tingling of the right hand or lateral deltoid.  Pt was going up for a layup playing basketball and fell backward landing onto his shoulder laterally.  Pt pain was 10/10 but received of fentanyl en route by EMS and feels much better.  Denies hitting head or LOC.  No neck pain.   Past Medical History  Diagnosis Date  . Atrial septal defect     repaired 5 yrs. ago  . Heart murmur     ASD - s/p repair  . Runny nose     clear drainage  . Fracture of finger, middle phalanx, closed     right ring; injured 06/09/11 while playing flag football    Past Surgical History  Procedure Date  . Asd repair 5 yrs. ago    [facility]  . Orif finger fracture 06/25/2011    Procedure: OPEN REDUCTION INTERNAL FIXATION (ORIF) METACARPAL (FINGER) FRACTURE;  Surgeon: Nicki Reaper, MD;  Location: [facility];  Service: Orthopedics;  Laterality: Right;  right ring middle phalanx     Family History  Problem Relation Age of Onset  . Hypertension Other     History  Substance Use Topics  . Smoking status: Current Everyday Smoker -- 2 years    Types: Cigarettes  . Smokeless tobacco: Never Used   Comment: 1 pack every 3 days  . Alcohol Use: Yes     once every 2 weeks      Review of Systems  Constitutional: Negative for fever and chills.  HENT: Negative for sore throat and trouble  swallowing.   Eyes: Negative for visual disturbance.  Respiratory: Negative for cough, choking and shortness of breath.   Cardiovascular: Negative for chest pain and palpitations.  Gastrointestinal: Negative for nausea, vomiting and abdominal pain.  Musculoskeletal: Negative for back pain.       Right shoulder pain.  Skin: Negative for wound.  Neurological: Negative for dizziness and headaches.    Allergies  Review of patient's allergies indicates no known allergies.  Home Medications  No current outpatient prescriptions on file.  BP 145/75  Pulse 77  Temp 98.4 F (36.9 C) (Oral)  Resp 16  SpO2 96%  Physical Exam  Constitutional: He is oriented to person, place, and time. He appears well-developed and well-nourished. No distress.  HENT:  Head: Normocephalic and atraumatic.  Right Ear: External ear normal.  Left Ear: External ear normal.  Mouth/Throat: Oropharynx is clear and moist. No oropharyngeal exudate.  Eyes: Conjunctivae and EOM are normal. Pupils are equal, round, and reactive to light.  Neck: Normal range of motion. Neck supple.  Cardiovascular: Normal rate, regular rhythm and intact distal pulses.   Murmur heard.      Soft systolic murmur at LSB  Pulmonary/Chest: Effort normal and breath sounds normal.  Abdominal: Soft. Bowel sounds are normal.  Musculoskeletal:  Right arm held abducted, bulge to anterior lateral pec, very tender to palpation in region of humeral head. Sensation intact to lateral deltoid.  Sensation intact distally, distal pulses 2+, strength 5/5 in hand and wrist.  Neurological: He is alert and oriented to person, place, and time.  Skin: Skin is warm and dry. He is not diaphoretic.    ED Course  Reduction of dislocation Date/Time: 03/24/2012 10:35 PM Performed by: Jones Skene Authorized by: Jones Skene Consent: Written consent obtained. Risks and benefits: risks, benefits and alternatives were discussed Consent given by:  patient Patient understanding: patient states understanding of the procedure being performed Patient consent: the patient's understanding of the procedure matches consent given Procedure consent: procedure consent matches procedure scheduled Relevant documents: relevant documents present and verified Imaging studies: imaging studies available Patient identity confirmed: verbally with patient and arm band Time out: Immediately prior to procedure a "time out" was called to verify the correct patient, procedure, equipment, support staff and site/side marked as required. Patient sedated: yes Sedatives: propofol Vitals: Vital signs were monitored during sedation. Patient tolerance: Patient tolerated the procedure well with no immediate complications. Comments: Post procedure neurovascular exam shows distal pulses, motor and sensation intact. Patient's pain relieved.  Post-reduction film confirm reduction.   (including critical care time)  Labs Reviewed - No data to display Dg Shoulder Right  03/24/2012  *RADIOLOGY REPORT*  Clinical Data: Postreduction  RIGHT SHOULDER - 2+ VIEW  Comparison: 03/24/2012  Findings: On two views, there is apparent relocation of the right humeral head with respect to the glenoid.  AC joint distance is normal.  Right lung apex is clear.  IMPRESSION: Apparent relocation of previously seen right shoulder dislocation.   Original Report Authenticated By: Harrel Lemon, M.D.    Dg Shoulder Right  03/24/2012  *RADIOLOGY REPORT*  Clinical Data: Shoulder pain status post fall.  RIGHT SHOULDER - 2+ VIEW  Comparison: 10/30/2011 radiographs.  Findings: There is recurrent anterior dislocation of the humeral head.  The humeral head is perched on the anterior glenoid with a probable resulting Hill-Sachs deformity.  No displaced fracture fragment is identified.  IMPRESSION: Recurrent anterior glenohumeral dislocation with probable associated Hill-Sachs deformity of the humeral head.    Original Report Authenticated By: Gerrianne Scale, M.D.      1. Anterior shoulder dislocation       MDM  Christopher [name] is a 21 y.o. male presenting with anterior shoulder dislocation confirmed on XR as interpreted by radiology and myself.  Preparations were made for conscious sedation with propofol for shoulder reduction - see note above.  EKG obtained per protocol: EKG: normal sinus rhythm, RBBB, non ischemic EKG.   Pt recovered uneventfully and prcedure was w/o complicaitons.  Pt to follow up with Orthopedics, Dr. Despina Hick in 2 weeks for re-evaluation.  He was given a Rx for ibuprofen and arm placed in a sling with return precautinos and shoulder care given. The patient understands and accepts the medical plan as it's been dictated and I have answered all questions. Discharge instructions concerning home care and prescriptions have been given.  The patient is STABLE and is discharged to home in good condition.         Jones Skene, MD 03/24/12 1610  Jones Skene, MD 03/24/12 2306

## 2012-03-24 NOTE — ED Notes (Signed)
EKG given to EDP, Wentz, MD. 

## 2012-10-31 ENCOUNTER — Emergency Department (HOSPITAL_COMMUNITY): Payer: Self-pay

## 2012-10-31 ENCOUNTER — Encounter (HOSPITAL_COMMUNITY): Payer: Self-pay | Admitting: Emergency Medicine

## 2012-10-31 ENCOUNTER — Emergency Department (HOSPITAL_COMMUNITY)
Admission: EM | Admit: 2012-10-31 | Discharge: 2012-11-01 | Disposition: A | Discharge status: Home / Self Care | Payer: Self-pay | Attending: Emergency Medicine | Admitting: Emergency Medicine

## 2012-10-31 DIAGNOSIS — R011 Cardiac murmur, unspecified: Secondary | ICD-10-CM | POA: Insufficient documentation

## 2012-10-31 DIAGNOSIS — Y998 Other external cause status: Secondary | ICD-10-CM | POA: Insufficient documentation

## 2012-10-31 DIAGNOSIS — Y9367 Activity, basketball: Secondary | ICD-10-CM | POA: Insufficient documentation

## 2012-10-31 DIAGNOSIS — X503XXA Overexertion from repetitive movements, initial encounter: Secondary | ICD-10-CM | POA: Insufficient documentation

## 2012-10-31 DIAGNOSIS — F17200 Nicotine dependence, unspecified, uncomplicated: Secondary | ICD-10-CM | POA: Insufficient documentation

## 2012-10-31 DIAGNOSIS — Y9239 Other specified sports and athletic area as the place of occurrence of the external cause: Secondary | ICD-10-CM | POA: Insufficient documentation

## 2012-10-31 DIAGNOSIS — M21829 Other specified acquired deformities of unspecified upper arm: Secondary | ICD-10-CM

## 2012-10-31 DIAGNOSIS — Z8774 Personal history of (corrected) congenital malformations of heart and circulatory system: Secondary | ICD-10-CM | POA: Insufficient documentation

## 2012-10-31 DIAGNOSIS — M24411 Recurrent dislocation, right shoulder: Secondary | ICD-10-CM

## 2012-10-31 DIAGNOSIS — Z8781 Personal history of (healed) traumatic fracture: Secondary | ICD-10-CM | POA: Insufficient documentation

## 2012-10-31 DIAGNOSIS — Y92838 Other recreation area as the place of occurrence of the external cause: Secondary | ICD-10-CM | POA: Insufficient documentation

## 2012-10-31 DIAGNOSIS — S43016A Anterior dislocation of unspecified humerus, initial encounter: Secondary | ICD-10-CM | POA: Insufficient documentation

## 2012-10-31 HISTORY — DX: Recurrent dislocation, unspecified shoulder: M24.419

## 2012-10-31 NOTE — ED Notes (Signed)
Pt was playing basketball and another player ripped the ball out of his hands and he feels like he dislocated his R shoulder, as he has done twice in the past.

## 2012-11-01 ENCOUNTER — Emergency Department (HOSPITAL_COMMUNITY): Payer: Self-pay

## 2012-11-01 MED ORDER — FENTANYL CITRATE 0.05 MG/ML IJ SOLN
100.0000 ug | Freq: Once | INTRAMUSCULAR | Status: AC
  Administered 2012-11-01: 100 ug via INTRAVENOUS
  Filled 2012-11-01: qty 2

## 2012-11-01 MED ORDER — ETOMIDATE 2 MG/ML IV SOLN
10.0000 mg | Freq: Once | INTRAVENOUS | Status: AC
  Administered 2012-11-01: 10 mg via INTRAVENOUS
  Filled 2012-11-01: qty 10

## 2012-11-01 MED ORDER — OXYCODONE-ACETAMINOPHEN 5-325 MG PO TABS: 2.0000 | ORAL_TABLET | ORAL | Status: AC | PRN

## 2012-11-01 MED ORDER — ETOMIDATE 2 MG/ML IV SOLN
INTRAVENOUS | Status: AC | PRN
  Administered 2012-11-01: 10 mg via INTRAVENOUS

## 2012-11-01 NOTE — ED Provider Notes (Signed)
History     CSN: [id number]  Arrival date & time 10/31/12  2318   First MD Initiated Contact with Patient 10/31/12 2332      Chief Complaint  Patient presents with  . Shoulder Dislocation     (Consider location/radiation/quality/duration/timing/severity/associated sxs/prior treatment) HPI 22 yo male presents to the ER with complaint of right shoulder pain and probable dislocation.  Pt has h/o two prior dislocations.  No prior shoulder surgeries.  Pt was playing bball when the injury occurred.  Past Medical History  Diagnosis Date  . Atrial septal defect     repaired 5 yrs. ago  . Heart murmur     ASD - s/p repair  . Runny nose     clear drainage  . Fracture of finger, middle phalanx, closed     right ring; injured 06/09/11 while playing flag football  . Shoulder dislocation, recurrent     Past Surgical History  Procedure Laterality Date  . Asd repair  5 yrs. ago    [facility]  . Orif finger fracture  06/25/2011    Procedure: OPEN REDUCTION INTERNAL FIXATION (ORIF) METACARPAL (FINGER) FRACTURE;  Surgeon: Nicki Reaper, MD;  Location: [facility];  Service: Orthopedics;  Laterality: Right;  right ring middle phalanx     Family History  Problem Relation Age of Onset  . Hypertension Other     History  Substance Use Topics  . Smoking status: Current Every Day Smoker -- 2 years    Types: Cigarettes  . Smokeless tobacco: Never Used     Comment: 1 pack every 3 days  . Alcohol Use: Yes     Comment: once every 2 weeks      Review of Systems  All other systems reviewed and are negative.    Allergies  Review of patient's allergies indicates no known allergies.  Home Medications   Current Outpatient Rx  Name  Route  Sig  Dispense  Refill  . diphenhydrAMINE (BENADRYL) 25 MG tablet   Oral   Take 25 mg by mouth every 6 (six) hours as needed for itching or allergies.         Marland Kitchen ibuprofen (ADVIL,MOTRIN) 200 MG tablet   Oral   Take 200 mg by mouth every 6  (six) hours as needed for pain.         Marland Kitchen oxyCODONE-acetaminophen (PERCOCET/ROXICET) 5-325 MG per tablet   Oral   Take 2 tablets by mouth every 4 (four) hours as needed for pain.   20 tablet   0     BP 128/85  Pulse 65  Temp(Src) 97.9 F (36.6 C) (Oral)  Resp 15  SpO2 100%  Physical Exam  Nursing note and vitals reviewed. Constitutional: He is oriented to person, place, and time. He appears well-developed and well-nourished.  HENT:  Head: Normocephalic and atraumatic.  Right Ear: External ear normal.  Left Ear: External ear normal.  Nose: Nose normal.  Mouth/Throat: Oropharynx is clear and moist.  Eyes: Conjunctivae and EOM are normal. Pupils are equal, round, and reactive to light.  Neck: Normal range of motion. Neck supple. No JVD present. No tracheal deviation present. No thyromegaly present.  Cardiovascular: Normal rate, regular rhythm, normal heart sounds and intact distal pulses.  Exam reveals no gallop and no friction rub.   No murmur heard. Pulmonary/Chest: Effort normal and breath sounds normal. No stridor. No respiratory distress. He has no wheezes. He has no rales. He exhibits no tenderness.  Abdominal: Soft. Bowel sounds  are normal. He exhibits no distension and no mass. There is no tenderness. There is no rebound and no guarding.  Musculoskeletal: Normal range of motion. He exhibits tenderness. He exhibits no edema.  Right shoulder with deformity c/w dislocation.  Neurovascularly intact distally.  No loss of sensation over deltoid.  Lymphadenopathy:    He has no cervical adenopathy.  Neurological: He is alert and oriented to person, place, and time. He exhibits normal muscle tone. Coordination normal.  Skin: Skin is warm and dry. No rash noted. No erythema. No pallor.  Psychiatric: He has a normal mood and affect. His behavior is normal. Judgment and thought content normal.    ED Course  Procedural sedation Date/Time: 11/01/2012 12:23 AM Performed by: Olivia Mackie Authorized by: Olivia Mackie Consent: Verbal consent obtained. written consent obtained. Risks and benefits: risks, benefits and alternatives were discussed Consent given by: patient Patient understanding: patient states understanding of the procedure being performed Patient consent: the patient's understanding of the procedure matches consent given Procedure consent: procedure consent matches procedure scheduled Relevant documents: relevant documents present and verified Test results: test results available and properly labeled Imaging studies: imaging studies available Required items: required blood products, implants, devices, and special equipment available Patient identity confirmed: verbally with patient and arm band Time out: Immediately prior to procedure a "time out" was called to verify the correct patient, procedure, equipment, support staff and site/side marked as required. Preparation: Patient was prepped and draped in the usual sterile fashion. Local anesthesia used: no Patient sedated: yes Sedatives: etomidate Analgesia: fentanyl Sedation start date/time: 11/01/2012 12:23 AM Sedation end date/time: 11/01/2012 12:28 AM Vitals: Vital signs were monitored during sedation. Patient tolerance: Patient tolerated the procedure well with no immediate complications. Comments: Pt with right anterior dislocation.  Using external rotation and abduction shoulder was easily reduced.  Pt tolerated procedure well, woke from sedation giggling.  Procedural sedation Date/Time: 11/01/2012 12:23 AM Performed by: Olivia Mackie Authorized by: Olivia Mackie Consent: Verbal consent obtained. written consent obtained. Risks and benefits: risks, benefits and alternatives were discussed Consent given by: patient Patient understanding: patient states understanding of the procedure being performed Patient consent: the patient's understanding of the procedure matches consent given Procedure  consent: procedure consent matches procedure scheduled Relevant documents: relevant documents present and verified Test results: test results available and properly labeled Imaging studies: imaging studies available Required items: required blood products, implants, devices, and special equipment available Patient identity confirmed: verbally with patient Time out: Immediately prior to procedure a "time out" was called to verify the correct patient, procedure, equipment, support staff and site/side marked as required. Preparation: Patient was prepped and draped in the usual sterile fashion. Local anesthesia used: no Patient sedated: yes Sedatives: etomidate Analgesia: fentanyl Sedation start date/time: 11/01/2012 12:23 AM Sedation end date/time: 11/01/2012 12:28 AM Vitals: Vital signs were monitored during sedation. Patient tolerance: Patient tolerated the procedure well with no immediate complications.   (including critical care time)  Labs Reviewed - No data to display Dg Shoulder Right  10/31/2012  *RADIOLOGY REPORT*  Clinical Data: Dislocation  RIGHT SHOULDER - 2+ VIEW  Comparison: None.  Findings: There is complete anterior glenohumeral dislocation without fracture.  IMPRESSION: Anterior glenohumeral dislocation.   Original Report Authenticated By: Jolaine Click, M.D.    Dg Shoulder Right Port  11/01/2012  *RADIOLOGY REPORT*  Clinical Data: Postreduction right shoulder.  PORTABLE RIGHT SHOULDER - 2+ VIEW  Comparison: 10/31/2012  Findings: Interval relocation of the right shoulder  postreduction. The glenohumeral joint appears located.  There is angulation of the lateral aspect of the humeral head consistent with Hill-Sachs fracture.  Acromioclavicular and coracoclavicular spaces are intact.  IMPRESSION: Interval relocation of the right shoulder with Hill-Sachs fracture demonstrated.   Original Report Authenticated By: Burman Nieves, M.D.      1. Shoulder dislocation, recurrent, right    2. Hill Sachs deformity       MDM  22 yo male with right shoulder dislocation.  Reduced under conscious sedation easily.  Pt instructed to f/u with orthopedics.        Olivia Mackie, MD 11/01/12 [phone]

## 2012-11-01 NOTE — ED Notes (Signed)
Shoulder replaced with no difficulty. Pt beginning to wake back up.

## 2013-10-28 IMAGING — CR DG SHOULDER 2+V*R*
2 series · 2 of 2 positions shown · non-contrast
Comparison: 03/24/2012

CLINICAL DATA: Postreduction

RIGHT SHOULDER - 2+ VIEW

[x shoulder ap right (1 of 2)]
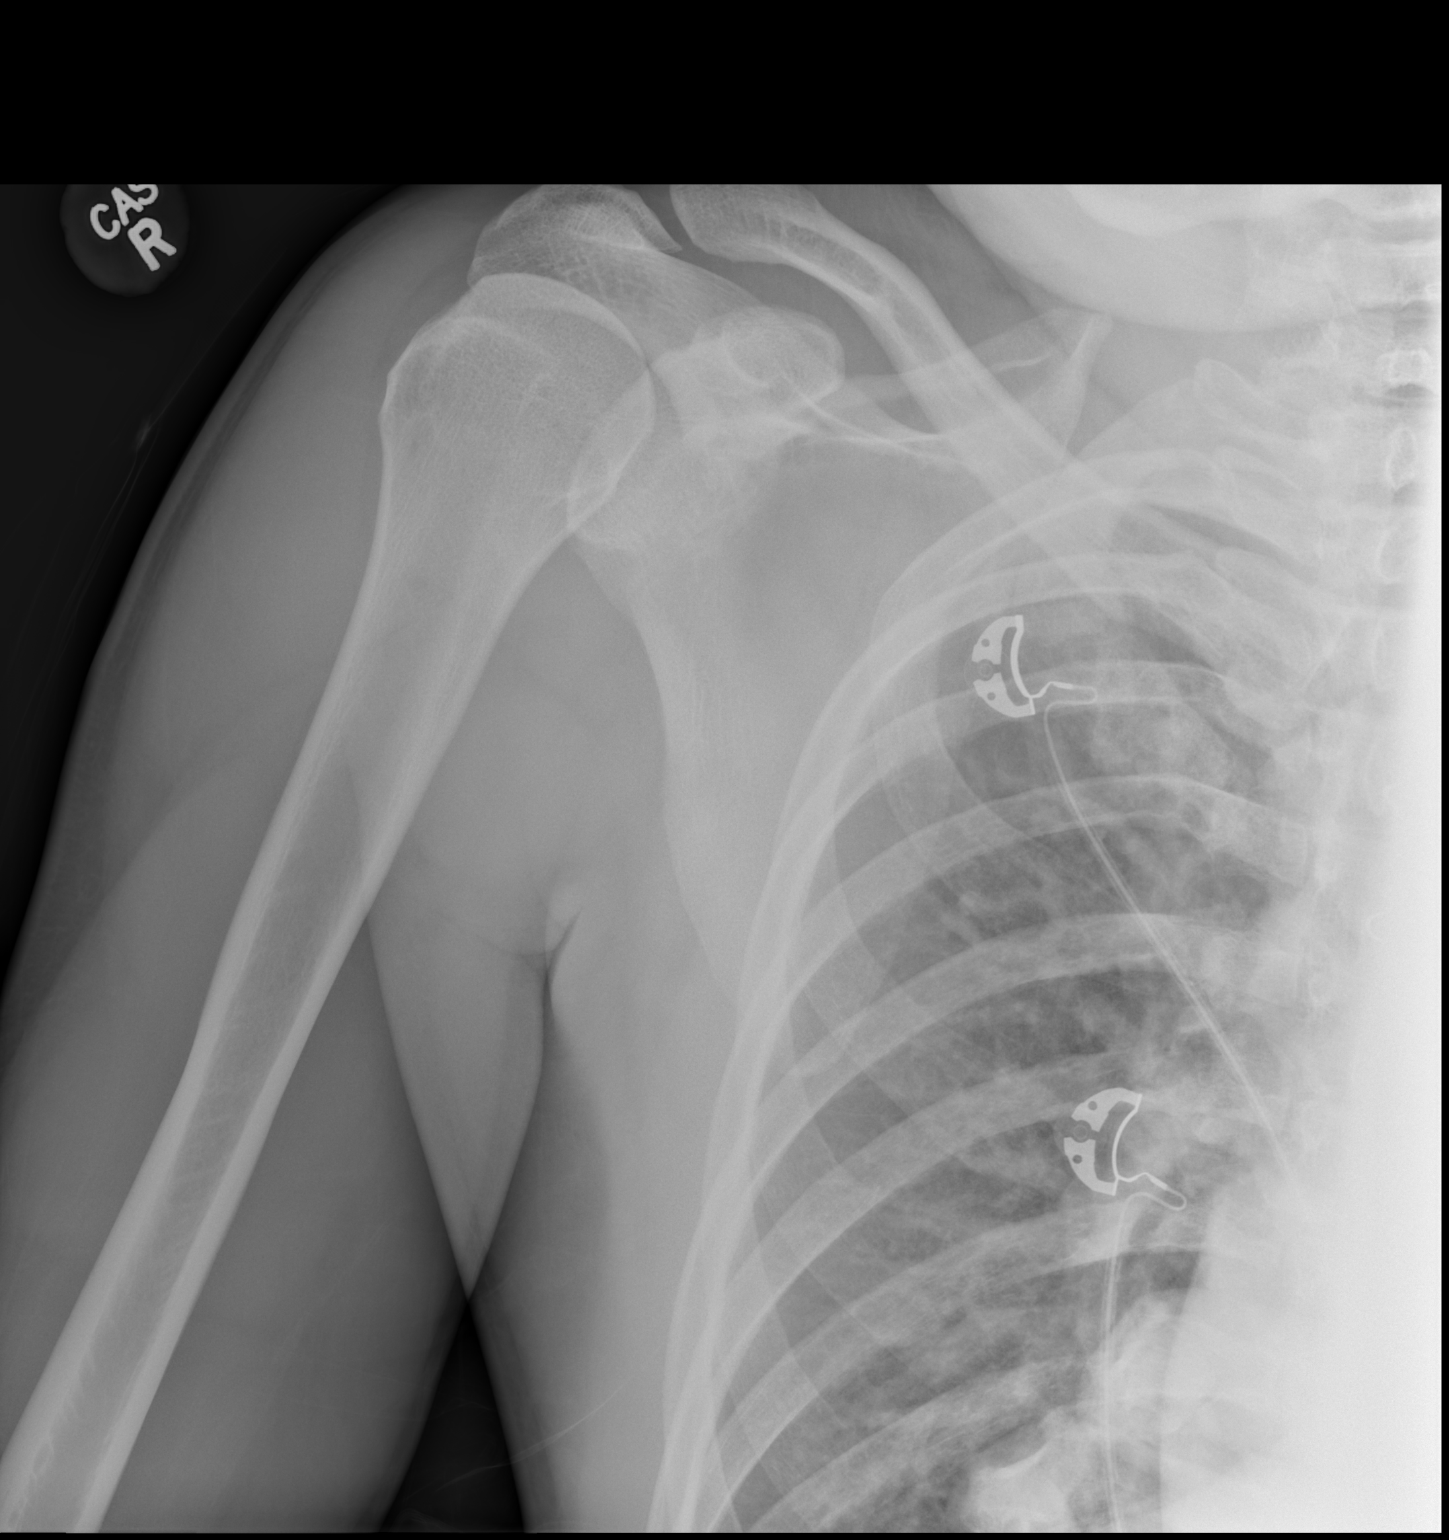

[x shoulder ap right (2 of 2)]
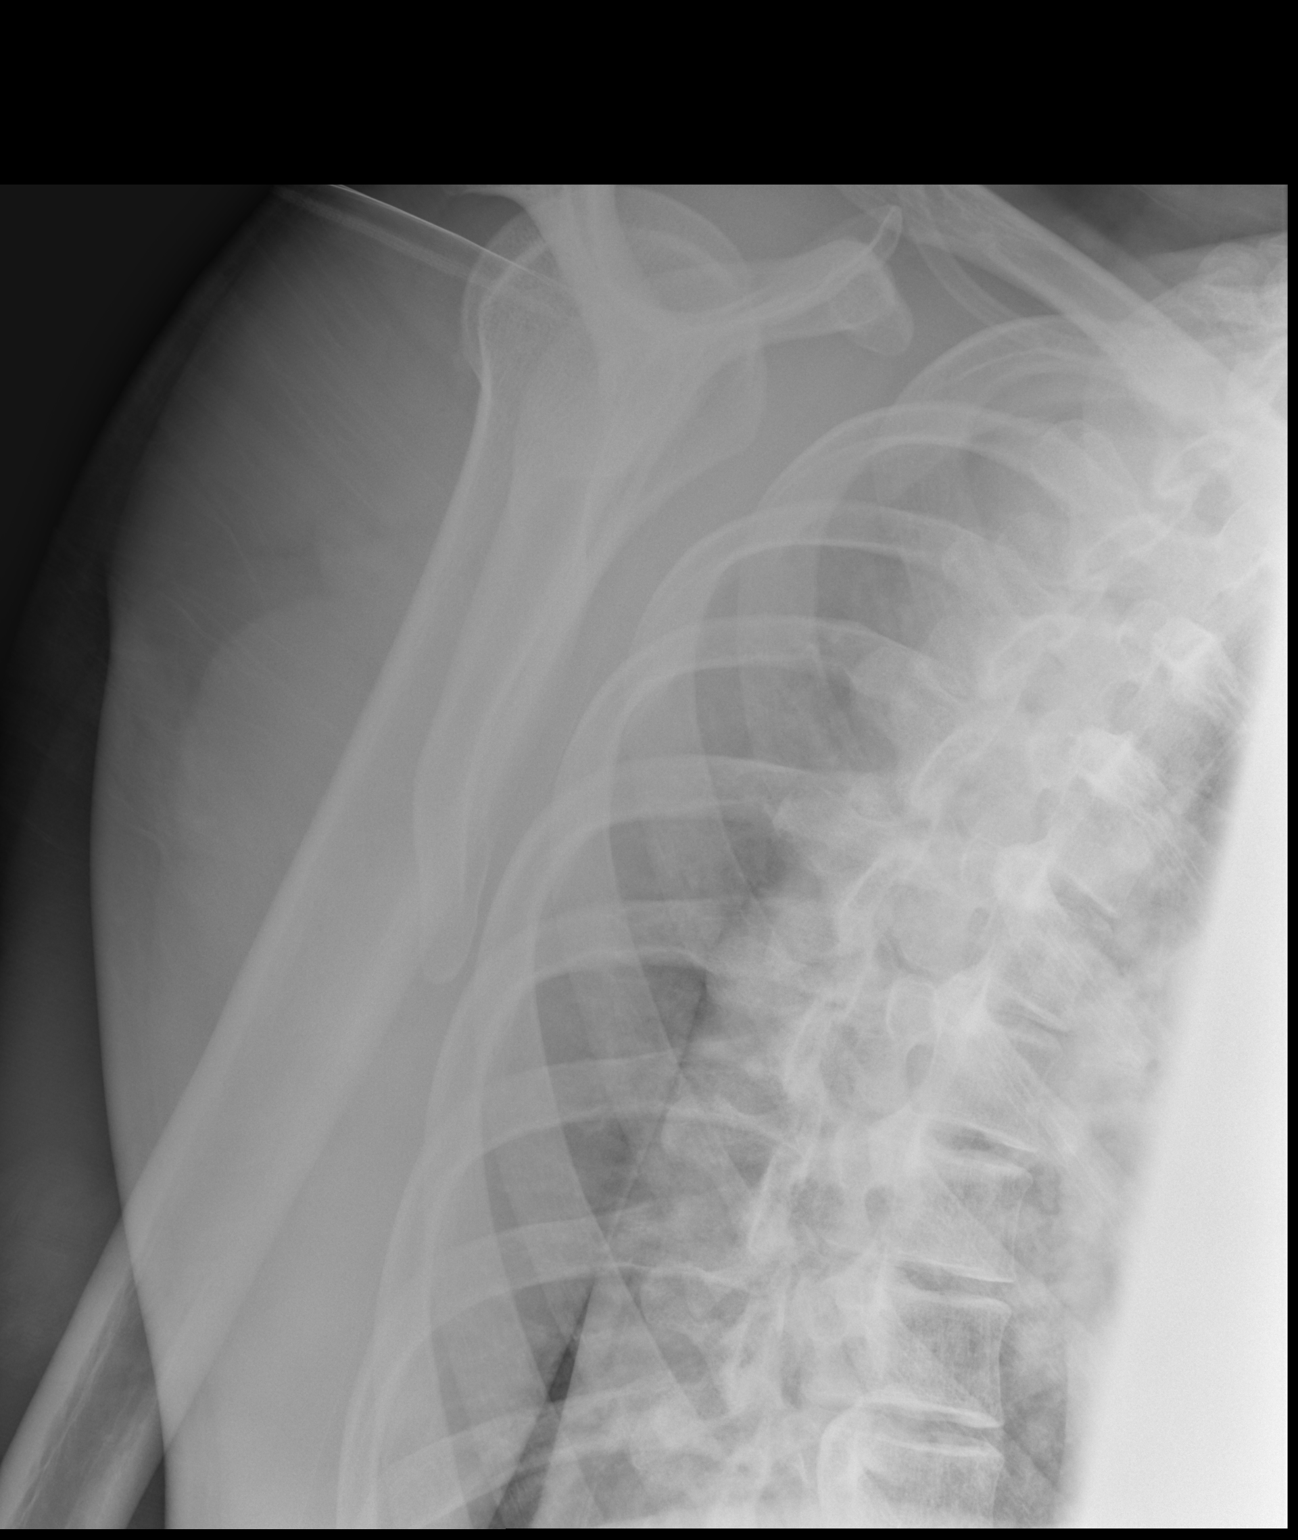

[2 of 2 positions shown; findings below may reference images not displayed]

FINDINGS: On two views, there is apparent relocation of the right
humeral head with respect to the glenoid.  AC joint distance is
normal.  Right lung apex is clear.
IMPRESSION: Apparent relocation of previously seen right shoulder dislocation.

## 2014-06-06 IMAGING — CR DG SHOULDER 2+V*R*
2 series · 2 of 2 positions shown · non-contrast
Comparison: None.

CLINICAL DATA: Dislocation

RIGHT SHOULDER - 2+ VIEW

[x shoulder ap right (1 of 2)]
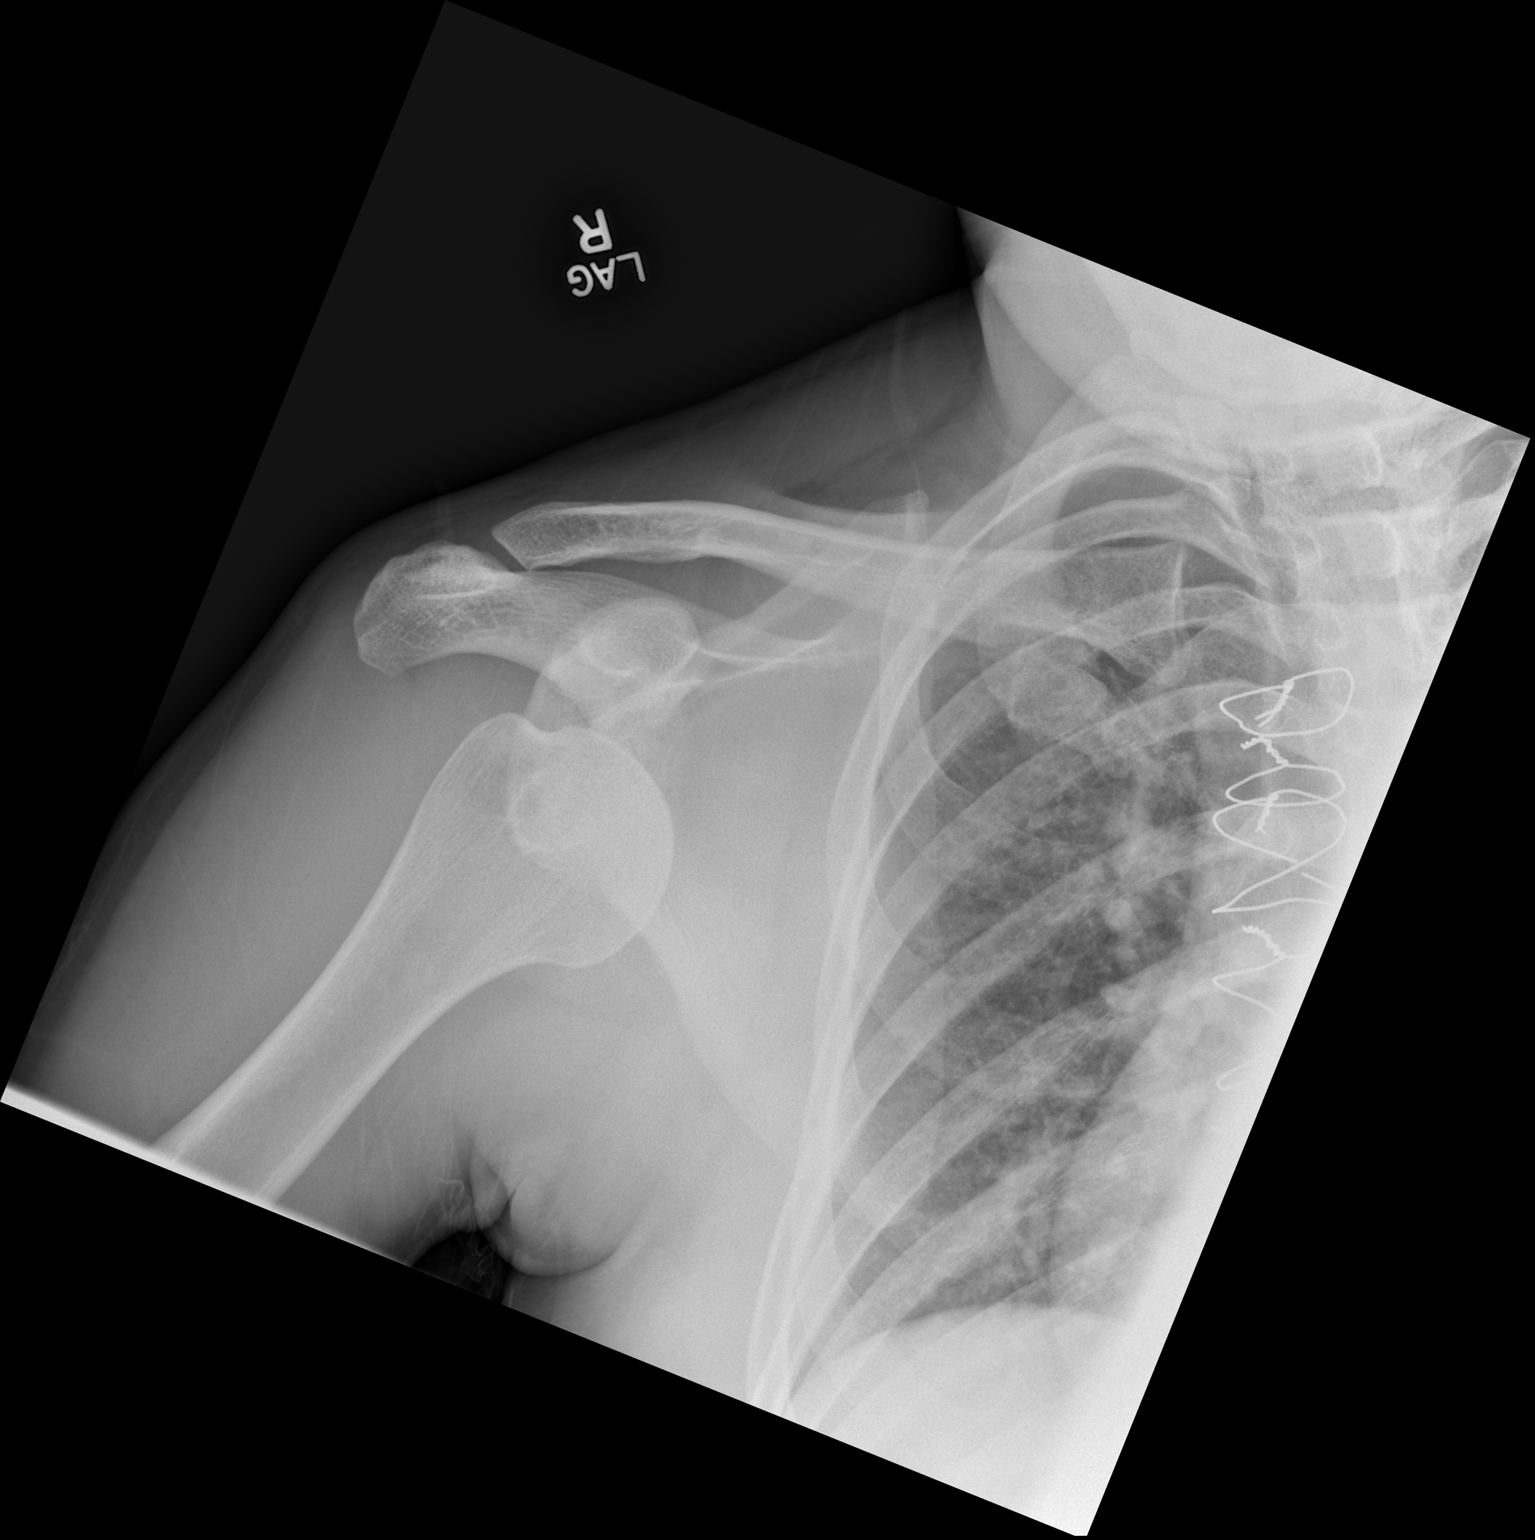

[x shoulder ap right (2 of 2)]
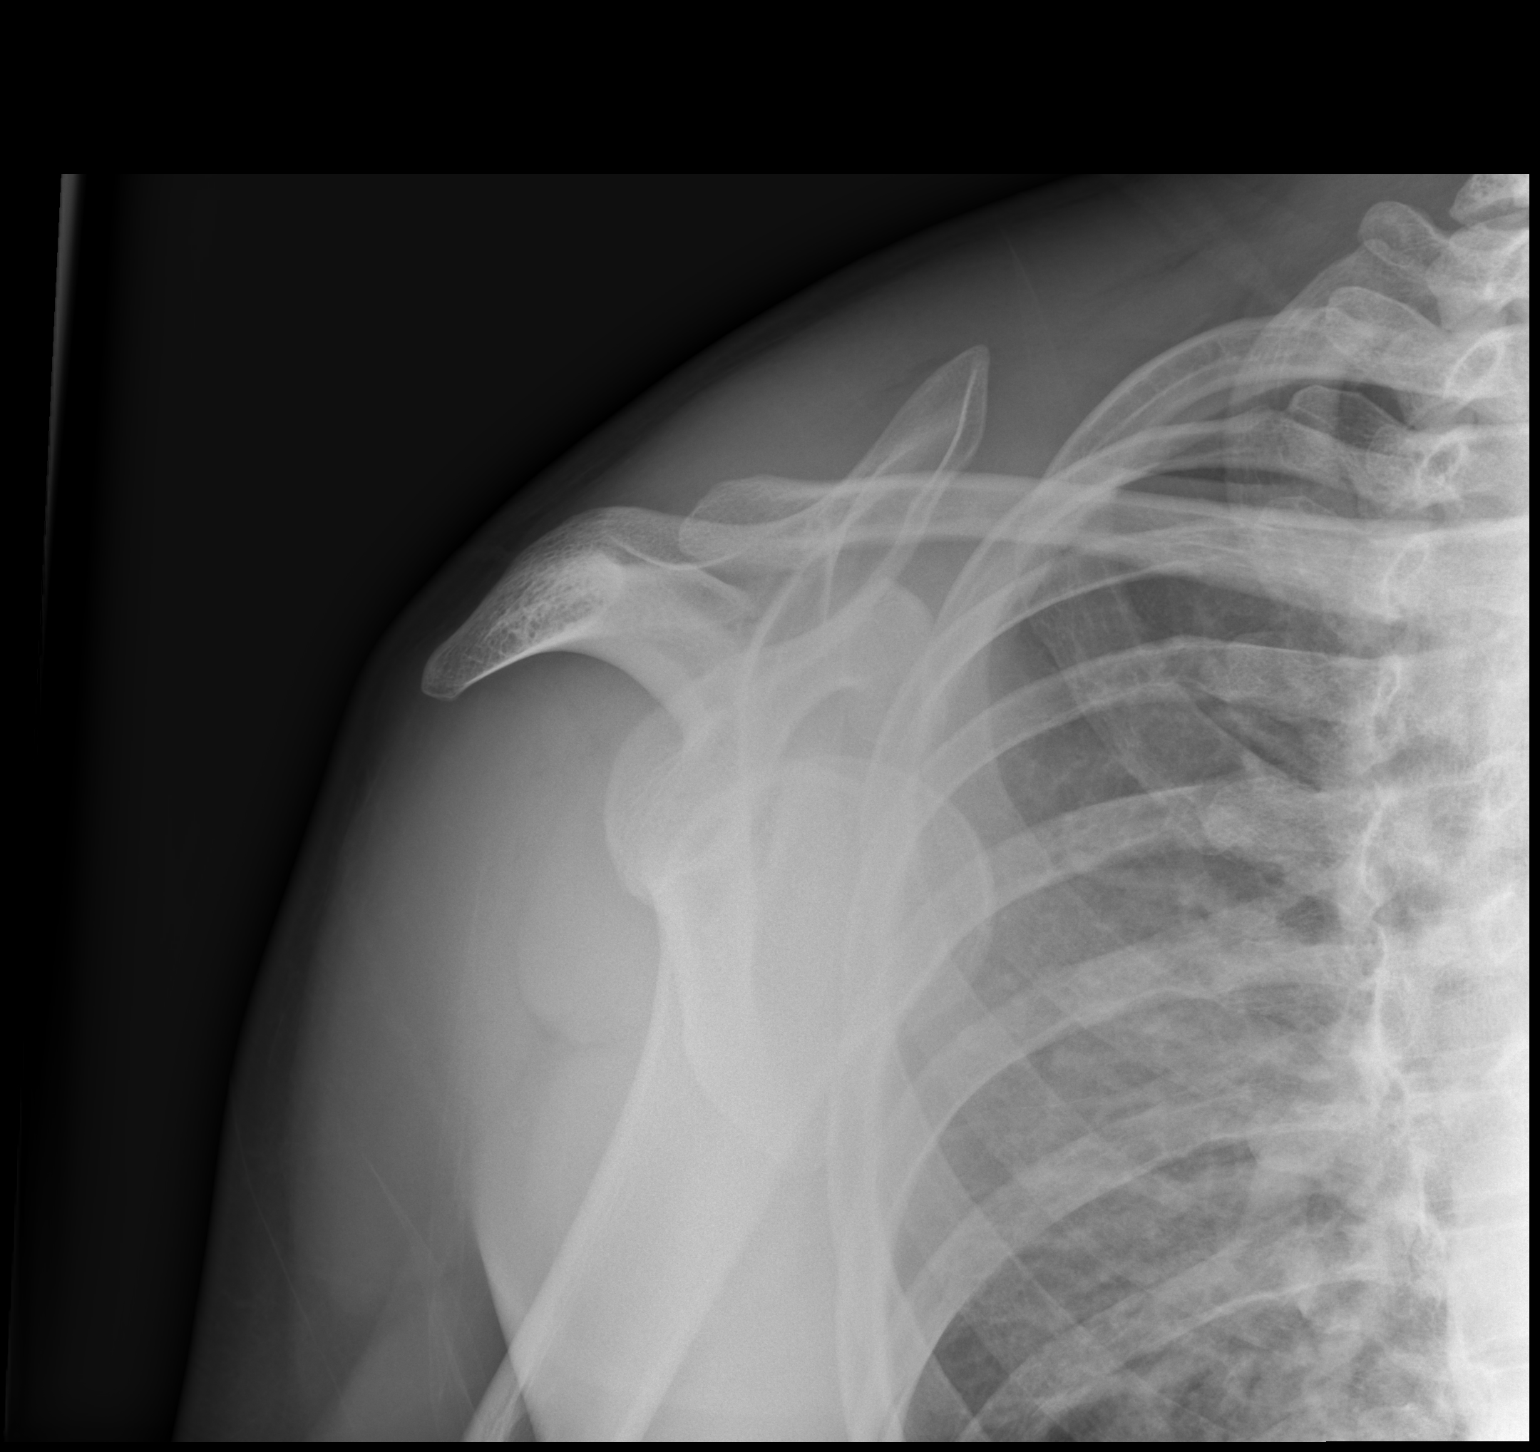

[2 of 2 positions shown; findings below may reference images not displayed]

FINDINGS: There is complete anterior glenohumeral dislocation
without fracture.
IMPRESSION: Anterior glenohumeral dislocation.

## 2014-06-07 IMAGING — CR DG SHOULDER 2+V PORT*R*
1 series · 2 of 2 positions shown · non-contrast
Comparison: 10/31/2012

CLINICAL DATA: Postreduction right shoulder.

PORTABLE RIGHT SHOULDER - 2+ VIEW

[Series 1: AP · U · 2 of 2 slices shown]
[im 1/2]
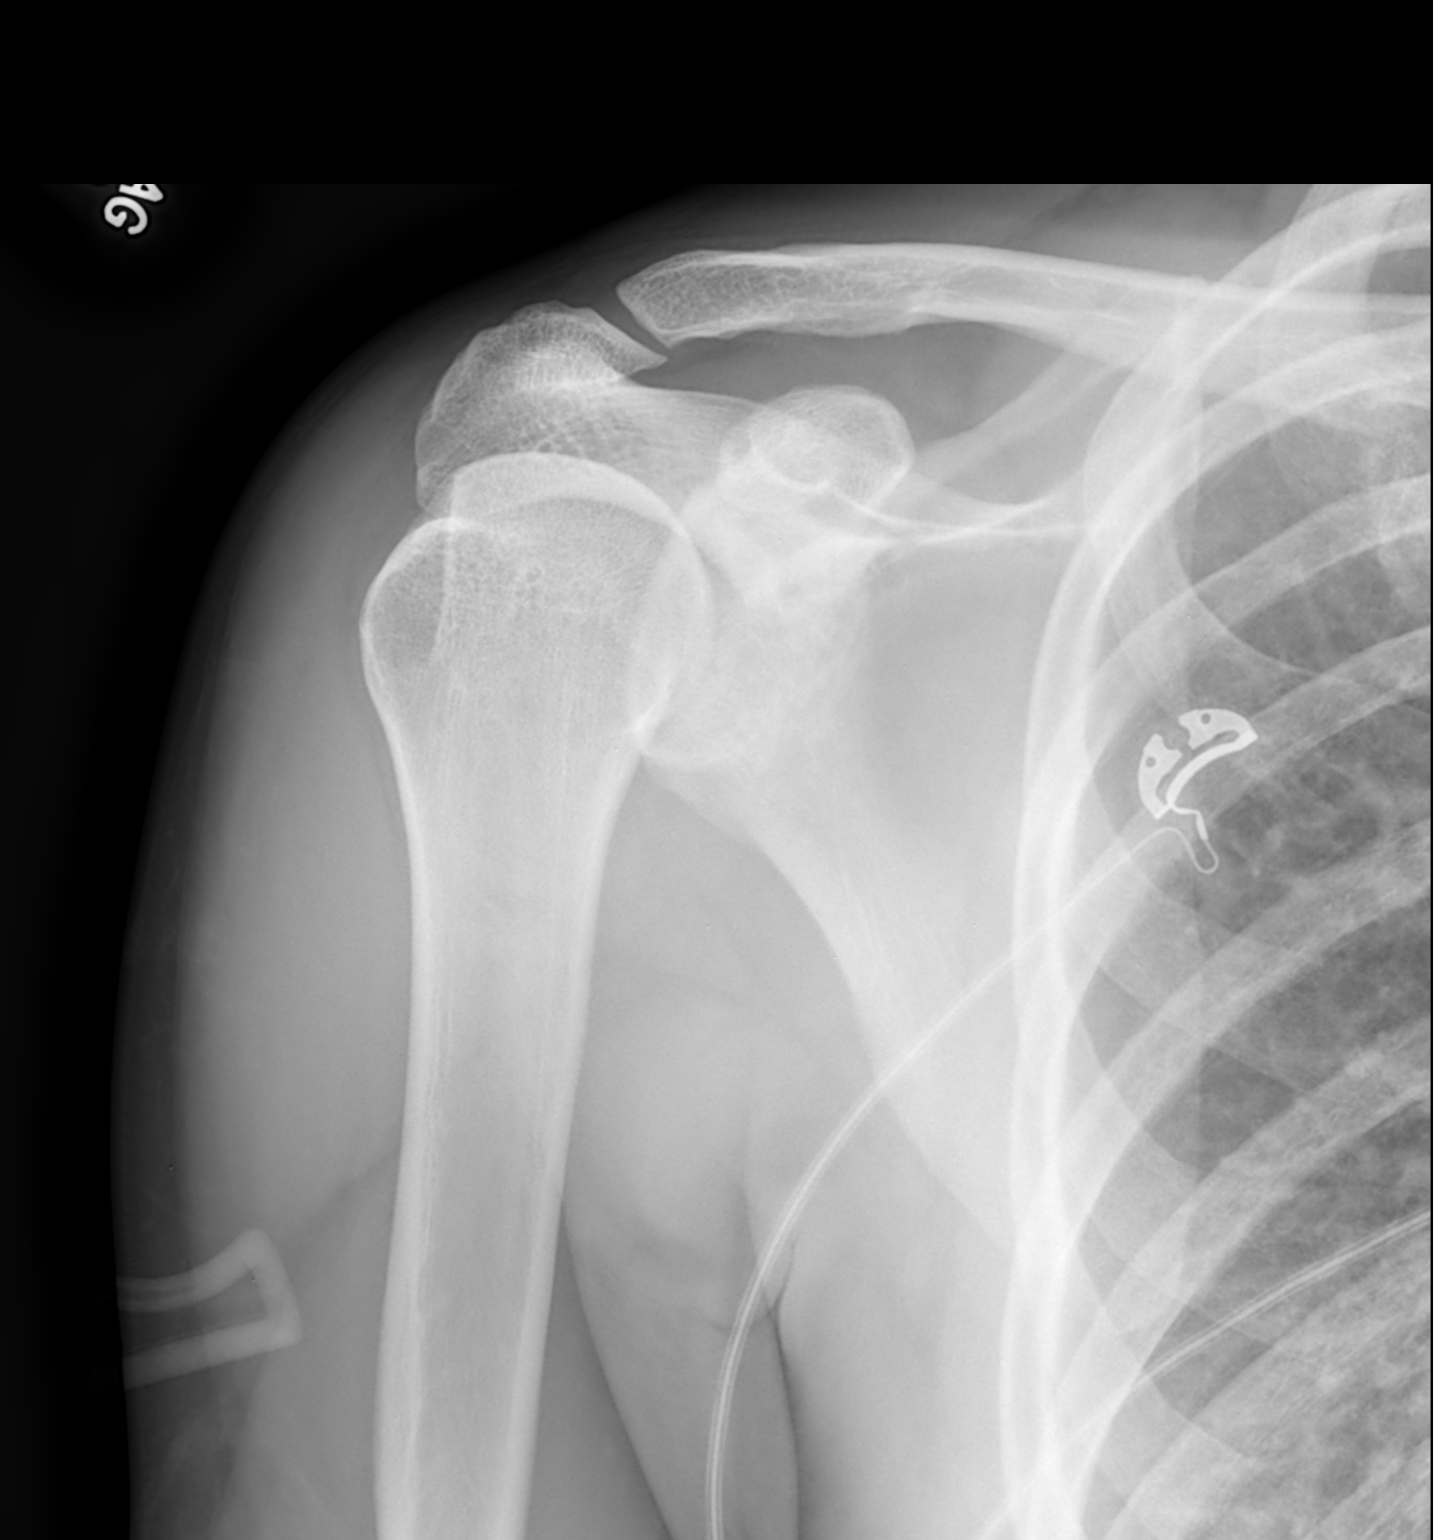
[im 2/2]
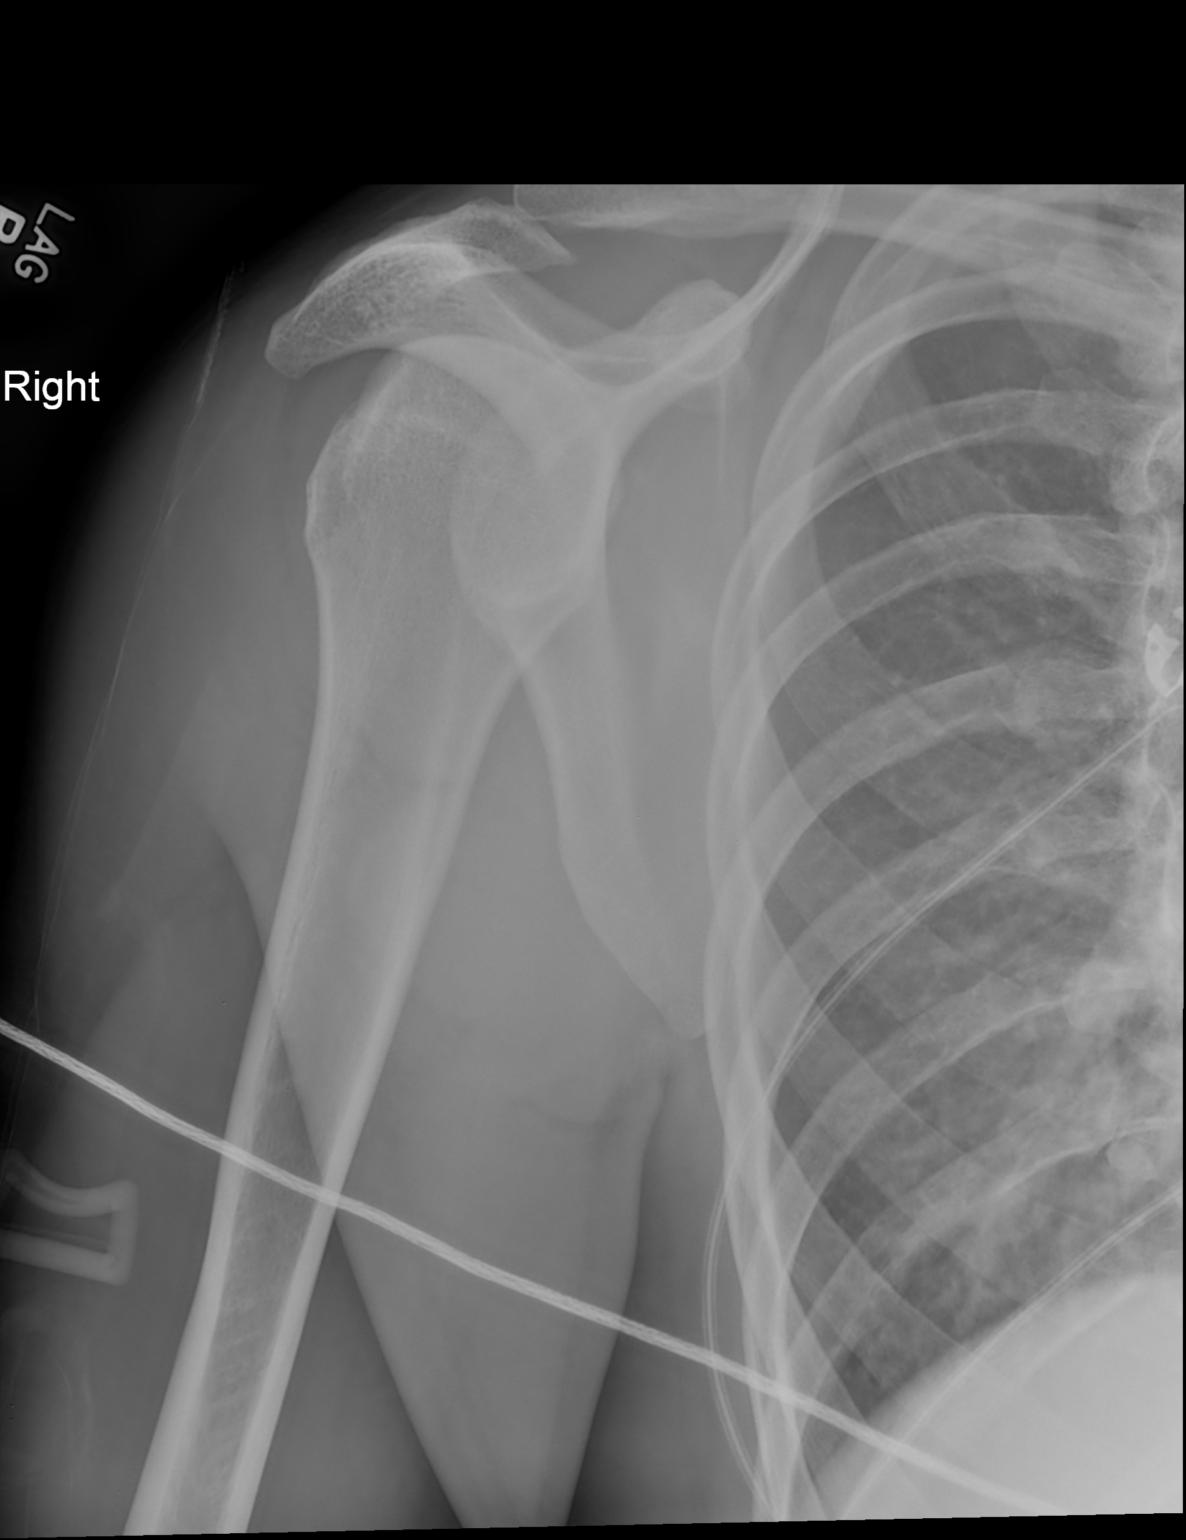

[2 of 2 positions shown; findings below may reference images not displayed]

FINDINGS: Interval relocation of the right shoulder postreduction.
The glenohumeral joint appears located.  There is angulation of the
lateral aspect of the humeral head consistent with Hill-Sachs
fracture.  Acromioclavicular and coracoclavicular spaces are
intact.
IMPRESSION: Interval relocation of the right shoulder with Hill-Sachs fracture
demonstrated.

## 2018-06-03 ENCOUNTER — Other Ambulatory Visit: Payer: Self-pay

## 2018-06-03 ENCOUNTER — Encounter (HOSPITAL_COMMUNITY): Payer: Self-pay | Admitting: Emergency Medicine

## 2018-06-03 DIAGNOSIS — R0981 Nasal congestion: Secondary | ICD-10-CM | POA: Diagnosis not present

## 2018-06-03 DIAGNOSIS — F17210 Nicotine dependence, cigarettes, uncomplicated: Secondary | ICD-10-CM | POA: Diagnosis not present

## 2018-06-03 DIAGNOSIS — R51 Headache: Secondary | ICD-10-CM | POA: Diagnosis present

## 2018-06-03 NOTE — ED Triage Notes (Signed)
Pt from home with c/o headache that has been ongoing x 4 days. Pt states he recently had a cold and has some sinus congestion. Pt states pain mostly in right temporal area. Pt denies changes in vision. Pt states he had migraines a "long time ago" and states this feels different from that. No neuro deficits

## 2018-06-04 ENCOUNTER — Emergency Department (HOSPITAL_COMMUNITY)
Admission: EM | Admit: 2018-06-04 | Discharge: 2018-06-04 | Disposition: A | Discharge status: Home / Self Care | Payer: BC Managed Care – PPO | Attending: Emergency Medicine | Admitting: Emergency Medicine

## 2018-06-04 DIAGNOSIS — R51 Headache: Secondary | ICD-10-CM

## 2018-06-04 DIAGNOSIS — R519 Headache, unspecified: Secondary | ICD-10-CM

## 2018-06-04 MED ORDER — PREDNISONE 10 MG (21) PO TBPK: ORAL_TABLET | ORAL | 0 refills | Status: DC

## 2018-06-04 NOTE — Discharge Instructions (Signed)
You may alternate Tylenol 1000 mg every 6 hours as needed for pain and Ibuprofen 800 mg every 8 hours as needed for pain.  Please take Ibuprofen with food. ° ° ° °To find a primary care or specialty doctor please call [phone] or [phone] to access "[facility] Find a Doctor Service." ° °You may also go on the [facility] website at [email or web address] ° °There are also multiple [facility], [facility], [facility] and [facility] practices throughout the [place] that are frequently accepting new patients. You may find a clinic that is close to your home and contact them. ° °[facility] -  °[address] °[place] [address] °[phone] ° ° °[facility] -  °[address] °[place] [address] °[phone] ° ° °[facility] - °[address]  °[place] [address] °[phone] ° ° °

## 2018-06-04 NOTE — ED Provider Notes (Signed)
TIME SEEN: 2:36 AM  CHIEF COMPLAINT: Headache  HPI: Patient is a 27 year old male who presents emergency department with intermittent headaches for the past 2-1/2 weeks.  States he thinks it is related to his sinuses.  He has had some sinus congestion.  Has had intermittent fever.  No cough.  Has had some sore throat.  States he took ibuprofen prior to arrival and headache is now gone.  No head injury.  No numbness, tingling or focal weakness.  No neck pain or neck stiffness.  ROS: See HPI Constitutional: Intermittent fever  Eyes: no drainage  ENT: no runny nose; + sinus congestion Cardiovascular:  no chest pain  Resp: no SOB  GI: no vomiting GU: no dysuria Integumentary: no rash  Allergy: no hives  Musculoskeletal: no leg swelling  Neurological: no slurred speech ROS otherwise negative  PAST MEDICAL HISTORY/PAST SURGICAL HISTORY:  Past Medical History:  Diagnosis Date  . Atrial septal defect    repaired 5 yrs. ago  . Fracture of finger, middle phalanx, closed    right ring; injured 06/09/11 while playing flag football  . Heart murmur    ASD - s/p repair  . Runny nose    clear drainage  . Shoulder dislocation, recurrent     MEDICATIONS:  Prior to Admission medications   Medication Sig Start Date End Date Taking? Authorizing Provider  diphenhydrAMINE (BENADRYL) 25 MG tablet Take 25 mg by mouth every 6 (six) hours as needed for itching or allergies.    [provider]  ibuprofen (ADVIL,MOTRIN) 200 MG tablet Take 200 mg by mouth every 6 (six) hours as needed for pain.    [provider]  oxyCODONE-acetaminophen (PERCOCET/ROXICET) 5-325 MG per tablet Take 2 tablets by mouth every 4 (four) hours as needed for pain. 11/01/12   Marisa Severin, MD    ALLERGIES:  No Known Allergies  SOCIAL HISTORY:  Social History   Tobacco Use  . Smoking status: Current Every Day Smoker    Years: 2.00    Types: Cigarettes  . Smokeless tobacco: Never Used  . Tobacco comment: 1  pack every 3 days  Substance Use Topics  . Alcohol use: Yes    Comment: once every 2 weeks    FAMILY HISTORY: Family History  Problem Relation Age of Onset  . Hypertension Other     EXAM: BP (!) 147/99 (BP Location: Left Arm)   Pulse 81   Temp 98.5 F (36.9 C) (Oral)   Resp 16   SpO2 99%  CONSTITUTIONAL: Alert and oriented and responds appropriately to questions. Well-appearing; well-nourished HEAD: Normocephalic EYES: Conjunctivae clear, pupils appear equal, EOMI ENT: normal nose; moist mucous membranes; No pharyngeal erythema or petechiae, no tonsillar hypertrophy or exudate, no uvular deviation, no unilateral swelling, no trismus or drooling, no muffled voice, normal phonation, no stridor, no dental caries present, no drainable dental abscess noted, no Ludwig's angina, tongue sits flat in the bottom of the mouth, no angioedema, no facial erythema or warmth, no facial swelling; no pain with movement of the neck.  TMs are clear bilaterally without erythema, purulence, bulging, perforation, effusion.  No cerumen impaction or sign of foreign body in the external auditory canal. No inflammation, erythema or drainage from the external auditory canal. No signs of mastoiditis. No pain with manipulation of the pinna bilaterally. NECK: Supple, no meningismus, no nuchal rigidity, no LAD  CARD: RRR; S1 and S2 appreciated; no murmurs, no clicks, no rubs, no gallops RESP: Normal chest excursion without splinting or  tachypnea; breath sounds clear and equal bilaterally; no wheezes, no rhonchi, no rales, no hypoxia or respiratory distress, speaking full sentences ABD/GI: Normal bowel sounds; non-distended; soft, non-tender, no rebound, no guarding, no peritoneal signs, no hepatosplenomegaly BACK:  The back appears normal and is non-tender to palpation, there is no CVA tenderness EXT: Normal ROM in all joints; non-tender to palpation; no edema; normal capillary refill; no cyanosis, no calf tenderness or  swelling    SKIN: Normal color for age and race; warm; no rash NEURO: Moves all extremities equally, normal sensation diffusely, normal gait, normal speech, no facial droop PSYCH: The patient's mood and manner are appropriate. Grooming and personal hygiene are appropriate.  MEDICAL DECISION MAKING: Patient here with likely sinus headache.  He describes it as diffuse, throbbing.  Currently gone after ibuprofen.  Has had intermittent symptoms for several weeks.  I do not feel he needs antibiotics.  Will discharge with brief steroid taper which may give him some relief.  He has been given outpatient follow-up.  Discussed return precautions.  Recommended alternating Tylenol and Motrin as needed for pain.  At this time, I do not feel there is any life-threatening condition present. I have reviewed and discussed all results (EKG, imaging, lab, urine as appropriate) and exam findings with patient/family. I have reviewed nursing notes and appropriate previous records.  I feel the patient is safe to be discharged home without further emergent workup and can continue workup as an outpatient as needed. Discussed usual and customary return precautions. Patient/family verbalize understanding and are comfortable with this plan.  Outpatient follow-up has been provided if needed. All questions have been answered.      Ward, Layla Maw, DO 06/04/18 [phone]

## 2019-08-20 ENCOUNTER — Encounter (HOSPITAL_COMMUNITY): Payer: Self-pay

## 2019-08-20 ENCOUNTER — Other Ambulatory Visit: Payer: Self-pay

## 2019-08-20 ENCOUNTER — Emergency Department (HOSPITAL_COMMUNITY)
Admission: EM | Admit: 2019-08-20 | Discharge: 2019-08-20 | Disposition: A | Discharge status: Home / Self Care | Payer: BC Managed Care – PPO | Attending: Emergency Medicine | Admitting: Emergency Medicine

## 2019-08-20 DIAGNOSIS — S81811A Laceration without foreign body, right lower leg, initial encounter: Secondary | ICD-10-CM

## 2019-08-20 DIAGNOSIS — F17210 Nicotine dependence, cigarettes, uncomplicated: Secondary | ICD-10-CM | POA: Diagnosis not present

## 2019-08-20 DIAGNOSIS — Z23 Encounter for immunization: Secondary | ICD-10-CM | POA: Diagnosis not present

## 2019-08-20 DIAGNOSIS — Y9389 Activity, other specified: Secondary | ICD-10-CM | POA: Insufficient documentation

## 2019-08-20 DIAGNOSIS — Y9259 Other trade areas as the place of occurrence of the external cause: Secondary | ICD-10-CM | POA: Diagnosis not present

## 2019-08-20 DIAGNOSIS — Y990 Civilian activity done for income or pay: Secondary | ICD-10-CM | POA: Insufficient documentation

## 2019-08-20 DIAGNOSIS — W2209XA Striking against other stationary object, initial encounter: Secondary | ICD-10-CM | POA: Diagnosis not present

## 2019-08-20 MED ORDER — TETANUS-DIPHTH-ACELL PERTUSSIS 5-2.5-18.5 LF-MCG/0.5 IM SUSP
0.5000 mL | Freq: Once | INTRAMUSCULAR | Status: AC
  Administered 2019-08-20: 0.5 mL via INTRAMUSCULAR
  Filled 2019-08-20: qty 0.5

## 2019-08-20 MED ORDER — BACITRACIN ZINC 500 UNIT/GM EX OINT: 1.0000 "application " | TOPICAL_OINTMENT | Freq: Two times a day (BID) | CUTANEOUS | 0 refills | Status: AC

## 2019-08-20 MED ORDER — LIDOCAINE-EPINEPHRINE (PF) 2 %-1:200000 IJ SOLN
INTRAMUSCULAR | Status: AC
  Filled 2019-08-20: qty 20

## 2019-08-20 NOTE — Discharge Instructions (Addendum)
We recommend Tylenol or ibuprofen for management of any pain or soreness.  Have your staples removed in 10-14 days.  Do not soak your wound in water such as while taking a bath.  You may shower normally.  Keep the area clean with mild soap and warm water.  Change your dressing at least once per day. Do not apply alcohol or peroxide as this can prolong wound healing and break down newly forming skin. Return to the ED for new or concerning symptoms, such as if signs of infection develop.

## 2019-08-20 NOTE — ED Triage Notes (Signed)
Pt hit his R lower leg on some equipment at work. Wound is bandaged and bleeding controlled at this time.

## 2019-08-20 NOTE — ED Provider Notes (Signed)
Christopher [name]   CSN: [id number] Arrival date & time: 08/20/19  0208     History Chief Complaint  Patient presents with  . Leg Injury    R    Christopher [name] is a 29 y.o. male.   29 year old male presents to the emergency department for evaluation of wound to his right lower extremity.  He was moving boxes on a metal pallet at [employer] when the corner of the palate hit his right lower leg.  Patient reports laceration with bleeding, controlled with pressure dressing applied at work.  He has some soreness to the area.  Has been ambulatory since the incident.  Denies any associated numbness or paresthesias.  Is not on chronic anticoagulation.  No medications taken prior to arrival.  Patient is unaware of the date of his last tetanus shot.  The history is provided by the patient. No language interpreter was used.       Past Medical History:  Diagnosis Date  . Atrial septal defect    repaired 5 yrs. ago  . Fracture of finger, middle phalanx, closed    right ring; injured 06/09/11 while playing flag football  . Heart murmur    ASD - s/p repair  . Runny nose    clear drainage  . Shoulder dislocation, recurrent     There are no problems to display for this patient.   Past Surgical History:  Procedure Laterality Date  . ASD REPAIR  5 yrs. ago   [facility]  . ORIF FINGER FRACTURE  06/25/2011   Procedure: OPEN REDUCTION INTERNAL FIXATION (ORIF) METACARPAL (FINGER) FRACTURE;  Surgeon: Nicki Reaper, MD;  Location: [facility];  Service: Orthopedics;  Laterality: Right;  right ring middle phalanx        Family History  Problem Relation Age of Onset  . Hypertension Other     Social History   Tobacco Use  . Smoking status: Current Every Day Smoker    Years: 2.00    Types: Cigarettes  . Smokeless tobacco: Never Used  . Tobacco comment: 1 pack every 3 days  Substance Use Topics  . Alcohol use: Yes    Comment: once every 2  weeks  . Drug use: No    Home Medications Prior to Admission medications   Medication Sig Start Date End Date Taking? Authorizing Provider  bacitracin ointment Apply 1 application topically 2 (two) times daily. 08/20/19   Antony Madura, PA-C  diphenhydrAMINE-PE-APAP (RA SEVERE ALLERGY PLUS SINUS) 25-5-325 MG TABS Take 1 tablet by mouth every 6 (six) hours as needed. Sinus headache    [provider]  ibuprofen (ADVIL,MOTRIN) 200 MG tablet Take 800 mg by mouth every 6 (six) hours as needed for moderate pain.     [provider]  oxyCODONE-acetaminophen (PERCOCET/ROXICET) 5-325 MG per tablet Take 2 tablets by mouth every 4 (four) hours as needed for pain. Patient not taking: Reported on 06/04/2018 11/01/12   Marisa Severin, MD  predniSONE (STERAPRED UNI-PAK 21 TAB) 10 MG (21) TBPK tablet Take as directed 06/04/18   Ward, Layla Maw, DO    Allergies    Patient has no known allergies.  Review of Systems   Review of Systems  Ten systems reviewed and are negative for acute change, except as noted in the HPI.    Physical Exam Updated Vital Signs BP (!) 129/91 (BP Location: Right Arm)   Pulse 89   Temp 98 F (36.7 C) (Oral)   Resp  18   Ht 6\' 5"  (1.956 m)   Wt 131.1 kg   SpO2 97%   BMI 34.27 kg/m   Physical Exam Vitals and nursing [name] reviewed.  Constitutional:      General: He is not in acute distress.    Appearance: He is well-developed. He is not diaphoretic.     Comments: Nontoxic appearing and in NAD  HENT:     Head: Normocephalic and atraumatic.  Eyes:     General: No scleral icterus.    Conjunctiva/sclera: Conjunctivae normal.  Cardiovascular:     Rate and Rhythm: Normal rate and regular rhythm.     Pulses: Normal pulses.     Comments: DP pulse 2+ in the RLE Pulmonary:     Effort: Pulmonary effort is normal. No respiratory distress.     Comments: Respirations even and unlabored Musculoskeletal:        General: Normal range of motion.     Cervical back:  Normal range of motion.     Right lower leg: Laceration present. No bony tenderness.       Legs:     Comments: Normal AROM and PROM of the RLE including the right ankle and foot.  Skin:    General: Skin is warm and dry.     Coloration: Skin is not pale.     Findings: No erythema or rash.  Neurological:     Mental Status: He is alert and oriented to person, place, and time.     Comments: Sensation to light touch intact in the RLE and R foot.  Psychiatric:        Mood and Affect: Mood is anxious.        Behavior: Behavior normal.     ED Results / Procedures / Treatments   Labs (all labs ordered are listed, but only abnormal results are displayed) Labs Reviewed - No data to display  EKG None  Radiology No results found.  Procedures Procedures (including critical care time)  LACERATION REPAIR Performed by: Authorized by: Antony Madura Consent: Verbal consent obtained. Risks and benefits: risks, benefits and alternatives were discussed Consent given by: patient Patient identity confirmed: provided demographic data Prepped and Draped in normal sterile fashion Wound explored  Laceration Location: RLE  Laceration Length: 10cm  No Foreign Bodies seen or palpated  Anesthesia: local infiltration  Local anesthetic: lidocaine 1% with epinephrine  Anesthetic total: 10 ml  Irrigation method: syringe Amount of cleaning: standard  Skin closure: staples  Number of staples: 10  Technique: simple  Patient tolerance: Patient tolerated the procedure well with no immediate complications.   Medications Ordered in ED Medications  lidocaine-EPINEPHrine (XYLOCAINE W/EPI) 2 %-1:200000 (PF) injection (has no administration in time range)  Tdap (BOOSTRIX) injection 0.5 mL (has no administration in time range)    ED Course  I have reviewed the triage vital signs and the nursing notes.  Pertinent labs & imaging results that were available during my care of the  patient were reviewed by me and considered in my medical decision making (see chart for details).    MDM Rules/Calculators/A&P                      Tdap booster given. Wound irrigated with 500cc sterile saline. Laceration occurred < 8 hours prior to repair which was well tolerated. Patient has no comorbidities to effect normal wound healing. Discussed wound home care with pt and answered questions. Pt to follow up for wound check  and staple removal in 10-14 days. Return precautions discussed and provided. Patient discharged in stable condition with no unaddressed concerns.   Final Clinical Impression(s) / ED Diagnoses Final diagnoses:  Laceration of right lower leg, initial encounter    Rx / DC Orders ED Discharge Orders         Ordered    bacitracin ointment  2 times daily     08/20/19 0302           Antonietta Breach, PA-C 08/20/19 2992    Ripley Fraise, MD 08/20/19 [phone]

## 2019-08-31 ENCOUNTER — Ambulatory Visit (HOSPITAL_COMMUNITY)
Admission: EM | Admit: 2019-08-31 | Discharge: 2019-08-31 | Disposition: A | Discharge status: Home / Self Care | Payer: BC Managed Care – PPO

## 2019-08-31 ENCOUNTER — Other Ambulatory Visit: Payer: Self-pay

## 2019-08-31 NOTE — ED Notes (Signed)
Ten (10) staples removed from right lower leg. Staples placed by provider at [facility].  Wound healed well with no bleeding.

## 2020-03-13 ENCOUNTER — Ambulatory Visit: Payer: BC Managed Care – PPO | Attending: Internal Medicine

## 2020-03-13 DIAGNOSIS — Z23 Encounter for immunization: Secondary | ICD-10-CM

## 2020-03-13 NOTE — Progress Notes (Signed)
   Covid-19 Vaccination Clinic  Name:  Christopher [name]    MRN: [id number] DOB: Jun 20, 1991  03/13/2020  Mr. Christopher [name] was observed post Covid-19 immunization for 15 minutes without incident. He was provided with Vaccine Information Sheet and instruction to access the V-Safe system.   Mr. Christopher [name] was instructed to call 911 with any severe reactions post vaccine: Marland Kitchen Difficulty breathing  . Swelling of face and throat  . A fast heartbeat  . A bad rash all over body  . Dizziness and weakness   Immunizations Administered    Name Date Dose VIS Date Route   Pfizer COVID-19 Vaccine 03/13/2020 11:46 AM 0.3 mL 09/28/2018 Intramuscular   Manufacturer: [employer], [employer]   Lot: [id number]   NDC: [address]-8

## 2020-04-03 ENCOUNTER — Ambulatory Visit: Payer: BC Managed Care – PPO | Attending: Critical Care Medicine

## 2020-04-03 DIAGNOSIS — Z23 Encounter for immunization: Secondary | ICD-10-CM

## 2020-04-03 NOTE — Progress Notes (Signed)
   Covid-19 Vaccination Clinic  Name:  Christopher [name]    MRN: [id number] DOB: [birth year]  04/03/2020  [name] was observed post Covid-19 immunization for 15 minutes without incident. He was provided with Vaccine Information Sheet and instruction to access the V-Safe system.   [name] was instructed to call 911 with any severe reactions post vaccine: Marland Kitchen Difficulty breathing  . Swelling of face and throat  . A fast heartbeat  . A bad rash all over body  . Dizziness and weakness   Immunizations Administered    Name Date Dose VIS Date Route   Pfizer COVID-19 Vaccine 04/03/2020  4:00 PM 0.3 mL 09/28/2018 Intramuscular   Manufacturer: [employer], [employer]   Lot: [id number]   NDC: [address]-4

## 2021-09-30 ENCOUNTER — Ambulatory Visit (INDEPENDENT_AMBULATORY_CARE_PROVIDER_SITE_OTHER): Payer: BC Managed Care – PPO | Admitting: Family Medicine

## 2021-09-30 ENCOUNTER — Encounter: Payer: Self-pay | Admitting: Family Medicine

## 2021-09-30 VITALS — BP 140/92 | Ht 77.0 in | Wt 272.0 lb

## 2021-09-30 DIAGNOSIS — M7701 Medial epicondylitis, right elbow: Secondary | ICD-10-CM | POA: Diagnosis not present

## 2021-09-30 NOTE — Patient Instructions (Signed)
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 Avoid painful activities as much as possible (unless doing home exercises) though activities are ok if pain stays at or below a 3 on a scale of 1-10. Tylenol or aleve as needed for pain. Icing 3-4 times a day - careful around ulnar nerve on inside of elbow. Do home exercises as directed daily. Counterforce brace to unload area of pain while it heals - wear during the day. A wrist brace may be beneficial also if you're not noticing enough benefit with the other measures. Consider formal physical therapy, nitro patches if not improving. Follow up with me in 6 weeks.

## 2021-09-30 NOTE — Progress Notes (Signed)
PCP: Woodroe Chen, MD  Subjective:   HPI: Patient is a 31 y.o. male here for right arm pain. December of 2022 he was in a bowling tournament and went to bowl and felt his shoulder dislocate followed by right medial elbow pain. He states he has a history of shoulder dislocation but most of the pain is his elbow. It a dull tension type pain.  He has attempted ibuprofen and has not been bowling since. This pain does not disturb his sleep or affect his daily activities. He works in [employer] and day to day job tasks do not include heavy lifting.  No numbness or tingling.  Past Medical History:  Diagnosis Date   Atrial septal defect    repaired 5 yrs. ago   Fracture of finger, middle phalanx, closed    right ring; injured 06/09/11 while playing flag football   Heart murmur    ASD - s/p repair   Runny nose    clear drainage   Shoulder dislocation, recurrent     Current Outpatient Medications on File Prior to Visit  Medication Sig Dispense Refill   bacitracin ointment Apply 1 application topically 2 (two) times daily. 15 g 0   diphenhydrAMINE-PE-APAP (RA SEVERE ALLERGY PLUS SINUS) 25-5-325 MG TABS Take 1 tablet by mouth every 6 (six) hours as needed. Sinus headache     ibuprofen (ADVIL,MOTRIN) 200 MG tablet Take 800 mg by mouth every 6 (six) hours as needed for moderate pain.      oxyCODONE-acetaminophen (PERCOCET/ROXICET) 5-325 MG per tablet Take 2 tablets by mouth every 4 (four) hours as needed for pain. (Patient not taking: Reported on 06/04/2018) 20 tablet 0   predniSONE (STERAPRED UNI-PAK 21 TAB) 10 MG (21) TBPK tablet Take as directed 21 tablet 0   No current facility-administered medications on file prior to visit.    Past Surgical History:  Procedure Laterality Date   ASD REPAIR  5 yrs. ago   [facility]   ORIF FINGER FRACTURE  06/25/2011   Procedure: OPEN REDUCTION INTERNAL FIXATION (ORIF) METACARPAL (FINGER) FRACTURE;  Surgeon: Nicki Reaper, MD;  Location: [facility];   Service: Orthopedics;  Laterality: Right;  right ring middle phalanx     No Known Allergies  BP (!) 140/92    Ht 6\' 5"  (1.956 m)    Wt 272 lb (123.4 kg)    BMI 32.25 kg/m   [facility] 09/30/2021  Frequency of aerobic exercise (# of days/week) 3  Frequency of strengthening activities (# of days/week) 0    No flowsheet data found.      Objective:  Physical Exam:  Gen: NAD, comfortable in exam room  Right Elbow: - Inspection: no obvious deformity. No swelling, erythema or bruising - Palpation: TTP at the medial epicondyle, common flexor tendon - ROM: full active ROM in flexion and extension. No crepitus - Strength: 5/5 strength in wrist flexion and extension without pain. 5/5 strength in biceps. - Neuro: NV intact distally - Special testing: no laxity with varus/valgus stress. Pain at medial epicondyle with resisted pronation. Negative tinel's at cubital tunnel  Right Shoulder: Inspection reveals no obvious deformity Palpation is normal with no TTP over [facility] joint or bicipital groove. Full ROM in flexion, abduction, internal/external rotation NV intact distally  Special Tests:  - Impingement: Neg Hawkins, neers, empty can sign. - Biceps tendon: Negative Speeds, Yerrgason's    Assessment & Plan:  1. Medial epicondylitis Several months of medial epicondylar pain after excessive  elbow load/strain such as bowling in this patient. Will treat conservatively as follows.  - Avoid bowling and other painful activities; daily living activities as tolerated -Tylenol/aleve PRN for pain, ice -Home exercise program -Daily counterforce brace; consider wrist brace -Follow up in 6 weeks; if no improvement consider nitro patches and formal PT

## 2021-11-20 ENCOUNTER — Ambulatory Visit: Payer: BC Managed Care – PPO | Admitting: Family Medicine

## 2022-03-05 ENCOUNTER — Ambulatory Visit
Admission: EM | Admit: 2022-03-05 | Discharge: 2022-03-05 | Disposition: A | Discharge status: Home / Self Care | Payer: BC Managed Care – PPO | Attending: Internal Medicine | Admitting: Internal Medicine

## 2022-03-05 ENCOUNTER — Ambulatory Visit (INDEPENDENT_AMBULATORY_CARE_PROVIDER_SITE_OTHER): Payer: BC Managed Care – PPO

## 2022-03-05 DIAGNOSIS — M25571 Pain in right ankle and joints of right foot: Secondary | ICD-10-CM

## 2022-03-05 DIAGNOSIS — M25471 Effusion, right ankle: Secondary | ICD-10-CM

## 2022-03-05 MED ORDER — PREDNISONE 20 MG PO TABS: 40.0000 mg | ORAL_TABLET | Freq: Every day | ORAL | 0 refills | Status: AC

## 2022-03-05 NOTE — ED Provider Notes (Signed)
[facility]    CSN: [id number] Arrival date & time: 03/05/22  1759      History   Chief Complaint Chief Complaint  Patient presents with   Ankle Pain    Not sure what I did to it but it has been swollen for 1 and a half weeks. A lot of discomfort and pain has been manageable most times but starting to feel worse. - Entered by patient    HPI Christopher [name] is a 31 y.o. male.   Patient presents with right ankle pain and swelling that has been present for about a week.  Denies any obvious injury.  He is able to bear weight.  He has not taken any medications for pain.  Denies history of chronic pain or gout.  Although, he does report that he was suspected to have gout in the ankle multiple years ago after eating [employer] consecutively.   Ankle Pain   Past Medical History:  Diagnosis Date   Atrial septal defect    repaired 5 yrs. ago   Fracture of finger, middle phalanx, closed    right ring; injured 06/09/11 while playing flag football   Heart murmur    ASD - s/p repair   Runny nose    clear drainage   Shoulder dislocation, recurrent     There are no problems to display for this patient.   Past Surgical History:  Procedure Laterality Date   ASD REPAIR  5 yrs. ago   [facility]   ORIF FINGER FRACTURE  06/25/2011   Procedure: OPEN REDUCTION INTERNAL FIXATION (ORIF) METACARPAL (FINGER) FRACTURE;  Surgeon: Nicki Reaper, MD;  Location: [facility];  Service: Orthopedics;  Laterality: Right;  right ring middle phalanx        Home Medications    Prior to Admission medications   Medication Sig Start Date End Date Taking? Authorizing Provider  predniSONE (DELTASONE) 20 MG tablet Take 2 tablets (40 mg total) by mouth daily for 5 days. 03/05/22 03/10/22 Yes Marlisa Caridi, Acie Fredrickson, FNP  bacitracin ointment Apply 1 application topically 2 (two) times daily. 08/20/19   Antony Madura, PA-C  diphenhydrAMINE-PE-APAP (RA SEVERE ALLERGY PLUS SINUS) 25-5-325 MG TABS Take 1  tablet by mouth every 6 (six) hours as needed. Sinus headache    [provider]  ibuprofen (ADVIL,MOTRIN) 200 MG tablet Take 800 mg by mouth every 6 (six) hours as needed for moderate pain.     [provider]  oxyCODONE-acetaminophen (PERCOCET/ROXICET) 5-325 MG per tablet Take 2 tablets by mouth every 4 (four) hours as needed for pain. Patient not taking: Reported on 06/04/2018 11/01/12   Marisa Severin, MD    Family History Family History  Problem Relation Age of Onset   Hypertension Other     Social History Social History   Tobacco Use   Smoking status: Every Day    Years: 2.00    Types: Cigarettes   Smokeless tobacco: Never   Tobacco comments:    1 pack every 3 days  Substance Use Topics   Alcohol use: Yes    Comment: once every 2 weeks   Drug use: No     Allergies   Patient has no known allergies.   Review of Systems Review of Systems Per HPI  Physical Exam Triage Vital Signs ED Triage Vitals [03/05/22 1806]  Enc Vitals Group     BP (!) 140/85     Pulse Rate 60     Resp 18  Temp 98 F (36.7 C)     Temp Source Oral     SpO2 98 %     Weight      Height      Head Circumference      Peak Flow      Pain Score 0     Pain Loc      Pain Edu?      Excl. in GC?    No data found.  Updated Vital Signs BP (!) 140/85 (BP Location: Left Arm)   Pulse 60   Temp 98 F (36.7 C) (Oral)   Resp 18   SpO2 98%   Visual Acuity Right Eye Distance:   Left Eye Distance:   Bilateral Distance:    Right Eye Near:   Left Eye Near:    Bilateral Near:     Physical Exam Constitutional:      General: He is not in acute distress.    Appearance: Normal appearance. He is not toxic-appearing or diaphoretic.  HENT:     Head: Normocephalic and atraumatic.  Eyes:     Extraocular Movements: Extraocular movements intact.     Conjunctiva/sclera: Conjunctivae normal.  Cardiovascular:     Pulses: Normal pulses.  Pulmonary:     Effort: Pulmonary effort is  normal.  Musculoskeletal:     Comments: Moderate circumferential swelling to right ankle.  No obvious discoloration, warmth, lacerations, abrasions noted.  Patient has full range of motion of ankle, foot, toes.  Neurovascular intact.  Neurological:     General: No focal deficit present.     Mental Status: He is alert and oriented to person, place, and time. Mental status is at baseline.  Psychiatric:        Mood and Affect: Mood normal.        Behavior: Behavior normal.        Thought Content: Thought content normal.        Judgment: Judgment normal.      UC Treatments / Results  Labs (all labs ordered are listed, but only abnormal results are displayed) Labs Reviewed - No data to display  EKG   Radiology DG Ankle Complete Right  Result Date: 03/05/2022 CLINICAL DATA:  Right ankle pain and edema for 1 week, no known trauma EXAM: RIGHT ANKLE - COMPLETE 3+ VIEW COMPARISON:  None Available. FINDINGS: There is no evidence of fracture, dislocation, or joint effusion. There is no evidence of arthropathy or other focal bone abnormality. Diffuse soft tissue edema about the ankle. IMPRESSION: No fracture or dislocation of the right ankle. Joint spaces are preserved. Diffuse soft tissue edema about the ankle. Electronically Signed   By: Jearld Lesch M.D.   On: 03/05/2022 18:33    Procedures Procedures (including critical care time)  Medications Ordered in UC Medications - No data to display  Initial Impression / Assessment and Plan / UC Course  I have reviewed the triage vital signs and the nursing notes.  Pertinent labs & imaging results that were available during my care of the patient were reviewed by me and considered in my medical decision making (see chart for details).     Right ankle x-ray was negative for any acute bony abnormality.  Differential diagnoses include gout versus osteoarthritis versus muscular strain/injury.  No concern for septic joint.  Will treat with  prednisone to decrease inflammation.  Patient has taken prednisone before and tolerated well.  No obvious contraindications to prednisone noted on exam.  Recommended supportive care including ice  application and elevation of extremity.  Advised patient to follow-up with provided contact information for orthopedist if pain persists or worsens.  Patient verbalized understanding and was agreeable with plan. Final Clinical Impressions(s) / UC Diagnoses   Final diagnoses:  Acute right ankle pain  Right ankle swelling     Discharge Instructions      X-ray was normal.  You have been prescribed prednisone to decrease inflammation.  Also recommend elevating extremity and using ice application.  Follow-up with orthopedist at provided contact information if symptoms persist or worsen.    ED Prescriptions     Medication Sig Dispense Auth. Provider   predniSONE (DELTASONE) 20 MG tablet Take 2 tablets (40 mg total) by mouth daily for 5 days. 10 tablet Gustavus Bryant, [place]      PDMP not reviewed this encounter.   Gustavus Bryant, [place] 03/05/22 [phone]

## 2022-03-05 NOTE — Discharge Instructions (Signed)
X-ray was normal.  You have been prescribed prednisone to decrease inflammation.  Also recommend elevating extremity and using ice application.  Follow-up with orthopedist at provided contact information if symptoms persist or worsen.

## 2022-03-05 NOTE — ED Triage Notes (Signed)
Pt c/o right ankle pain and edema x 1 week but does not recall any trauma or injury to the area.

## 2022-09-29 ENCOUNTER — Other Ambulatory Visit: Payer: Self-pay

## 2022-09-29 ENCOUNTER — Ambulatory Visit (HOSPITAL_COMMUNITY)
Admission: EM | Admit: 2022-09-29 | Discharge: 2022-09-29 | Disposition: A | Discharge status: Home / Self Care | Payer: BC Managed Care – PPO | Attending: Physician Assistant | Admitting: Physician Assistant

## 2022-09-29 ENCOUNTER — Encounter (HOSPITAL_COMMUNITY): Payer: Self-pay | Admitting: *Deleted

## 2022-09-29 DIAGNOSIS — J019 Acute sinusitis, unspecified: Secondary | ICD-10-CM

## 2022-09-29 MED ORDER — AMOXICILLIN-POT CLAVULANATE 875-125 MG PO TABS: 1.0000 | ORAL_TABLET | Freq: Two times a day (BID) | ORAL | 0 refills | Status: AC

## 2022-09-29 NOTE — ED Triage Notes (Signed)
Pt reports having sinus pain with Ha and rt sided nose bleed today.

## 2022-09-29 NOTE — ED Provider Notes (Signed)
[place]    CSN: [id number] Arrival date & time: 09/29/22  1435      History   Chief Complaint Chief Complaint  Patient presents with   Headache    Sinus issue, possibly sinus infection, head is hurting on right side. Nose started bleeding in right nostril and tenderness on right side of face. - Entered by patient   Facial Pain   Epistaxis    HPI Christopher [name] is a 32 y.o. male.   Complains of sinus pain and pressure that started about 3 weeks ago.  Reports more right-sided maxillary facial pain.  He reports initially he was experiencing more congestion, sinus pressure, runny nose.  He was feeling somewhat better until several days ago.  He denies fever, chills, nausea, vomiting, body aches.  He has been using OTC allergy medicine and Flonase with some relief.    Past Medical History:  Diagnosis Date   Atrial septal defect    repaired 5 yrs. ago   Fracture of finger, middle phalanx, closed    right ring; injured 06/09/11 while playing flag football   Heart murmur    ASD - s/p repair   Runny nose    clear drainage   Shoulder dislocation, recurrent     There are no problems to display for this patient.   Past Surgical History:  Procedure Laterality Date   ASD REPAIR  5 yrs. ago   [facility]   ORIF FINGER FRACTURE  06/25/2011   Procedure: OPEN REDUCTION INTERNAL FIXATION (ORIF) METACARPAL (FINGER) FRACTURE;  Surgeon: Wynonia Sours, MD;  Location: [facility];  Service: Orthopedics;  Laterality: Right;  right ring middle phalanx        Home Medications    Prior to Admission medications   Medication Sig Start Date End Date Taking? Authorizing Provider  amoxicillin-clavulanate (AUGMENTIN) 875-125 MG tablet Take 1 tablet by mouth 2 (two) times daily for 7 days. 09/29/22 10/06/22 Yes Ward, Lenise Arena, PA-C  bacitracin ointment Apply 1 application topically 2 (two) times daily. 08/20/19   Antonietta Breach, PA-C  diphenhydrAMINE-PE-APAP (RA SEVERE ALLERGY  PLUS SINUS) 25-5-325 MG TABS Take 1 tablet by mouth every 6 (six) hours as needed. Sinus headache    [provider]  ibuprofen (ADVIL,MOTRIN) 200 MG tablet Take 800 mg by mouth every 6 (six) hours as needed for moderate pain.     [provider]  oxyCODONE-acetaminophen (PERCOCET/ROXICET) 5-325 MG per tablet Take 2 tablets by mouth every 4 (four) hours as needed for pain. Patient not taking: Reported on 06/04/2018 11/01/12   Linton Flemings, MD    Family History Family History  Problem Relation Age of Onset   Hypertension Other     Social History Social History   Tobacco Use   Smoking status: Every Day    Years: 2.00    Types: Cigarettes   Smokeless tobacco: Never   Tobacco comments:    1 pack every 3 days  Substance Use Topics   Alcohol use: Yes    Comment: once every 2 weeks   Drug use: No     Allergies   Patient has no known allergies.   Review of Systems Review of Systems  Constitutional:  Negative for chills and fever.  HENT:  Positive for congestion, sinus pressure and sinus pain. Negative for ear pain and sore throat.   Eyes:  Negative for pain and visual disturbance.  Respiratory:  Negative for cough and shortness of breath.   Cardiovascular:  Negative for chest pain and palpitations.  Gastrointestinal:  Negative for abdominal pain and vomiting.  Genitourinary:  Negative for dysuria and hematuria.  Musculoskeletal:  Negative for arthralgias and back pain.  Skin:  Negative for color change and rash.  Neurological:  Negative for seizures and syncope.  All other systems reviewed and are negative.    Physical Exam Triage Vital Signs ED Triage Vitals  Enc Vitals Group     BP      Pulse      Resp      Temp      Temp src      SpO2      Weight      Height      Head Circumference      Peak Flow      Pain Score      Pain Loc      Pain Edu?      Excl. in [place]?    No data found.  Updated Vital Signs BP (!) 141/86   Pulse (!) 58   Temp  97.7 F (36.5 C)   Resp 18   SpO2 97%   Visual Acuity Right Eye Distance:   Left Eye Distance:   Bilateral Distance:    Right Eye Near:   Left Eye Near:    Bilateral Near:     Physical Exam Vitals and nursing note reviewed.  Constitutional:      General: He is not in acute distress.    Appearance: He is well-developed.  HENT:     Head: Normocephalic and atraumatic.     Comments: Right-sided maxillary sinus tenderness Eyes:     Conjunctiva/sclera: Conjunctivae normal.  Cardiovascular:     Rate and Rhythm: Normal rate and regular rhythm.     Heart sounds: No murmur heard. Pulmonary:     Effort: Pulmonary effort is normal. No respiratory distress.     Breath sounds: Normal breath sounds.  Abdominal:     Palpations: Abdomen is soft.     Tenderness: There is no abdominal tenderness.  Musculoskeletal:        General: No swelling.     Cervical back: Neck supple.  Skin:    General: Skin is warm and dry.     Capillary Refill: Capillary refill takes less than 2 seconds.  Neurological:     Mental Status: He is alert.  Psychiatric:        Mood and Affect: Mood normal.      UC Treatments / Results  Labs (all labs ordered are listed, but only abnormal results are displayed) Labs Reviewed - No data to display  EKG   Radiology No results found.  Procedures Procedures (including critical care time)  Medications Ordered in UC Medications - No data to display  Initial Impression / Assessment and Plan / UC Course  I have reviewed the triage vital signs and the nursing notes.  Pertinent labs & imaging results that were available during my care of the patient were reviewed by me and considered in my medical decision making (see chart for details).     Will treat for acute sinusitis given length of symptoms and sinus pain and pressure.  Supportive care discussed.  Return precautions discussed.  Patient overall well-appearing in no acute distress. Final Clinical  Impressions(s) / UC Diagnoses   Final diagnoses:  Acute non-recurrent sinusitis, unspecified location     Discharge Instructions      Take antibiotic as prescribed. Can use Flonase over the  next week. Can continue with allergy medicine daily. Drink plenty of fluids Can take ibuprofen or Tylenol as needed for pain. Return here or follow-up with PCP if no improvement.      ED Prescriptions     Medication Sig Dispense Auth. Provider   amoxicillin-clavulanate (AUGMENTIN) 875-125 MG tablet Take 1 tablet by mouth 2 (two) times daily for 7 days. 14 tablet Ward, Lenise Arena, PA-C      PDMP not reviewed this encounter.   Ward, Lenise Arena, PA-C 09/29/22 1715

## 2022-09-29 NOTE — Discharge Instructions (Addendum)
Take antibiotic as prescribed. Can use Flonase over the next week. Can continue with allergy medicine daily. Drink plenty of fluids Can take ibuprofen or Tylenol as needed for pain. Return here or follow-up with PCP if no improvement.

## 2023-01-10 ENCOUNTER — Emergency Department (HOSPITAL_BASED_OUTPATIENT_CLINIC_OR_DEPARTMENT_OTHER): Payer: BC Managed Care – PPO

## 2023-01-10 ENCOUNTER — Other Ambulatory Visit: Payer: Self-pay

## 2023-01-10 ENCOUNTER — Emergency Department (HOSPITAL_BASED_OUTPATIENT_CLINIC_OR_DEPARTMENT_OTHER)
Admission: EM | Admit: 2023-01-10 | Discharge: 2023-01-10 | Disposition: A | Discharge status: Home / Self Care | Payer: BC Managed Care – PPO | Attending: Emergency Medicine | Admitting: Emergency Medicine

## 2023-01-10 DIAGNOSIS — W06XXXA Fall from bed, initial encounter: Secondary | ICD-10-CM | POA: Diagnosis not present

## 2023-01-10 DIAGNOSIS — S92514A Nondisplaced fracture of proximal phalanx of right lesser toe(s), initial encounter for closed fracture: Secondary | ICD-10-CM | POA: Diagnosis not present

## 2023-01-10 DIAGNOSIS — S99921A Unspecified injury of right foot, initial encounter: Secondary | ICD-10-CM | POA: Diagnosis present

## 2023-01-10 NOTE — ED Triage Notes (Signed)
Reports standing up from bed, tripped on pull-up bar on ground. Bent foot backward. II toe painful and stuck straight-cannot bend at this time. Pain under foot as well, into heel.

## 2023-01-10 NOTE — ED Provider Notes (Signed)
[place] EMERGENCY DEPARTMENT AT [facility] Provider Note   CSN: [id number] Arrival date & time: 01/10/23  1930     History  Chief Complaint  Patient presents with   Foot Injury    Christopher [name] is a 32 y.o. male presents to the ED complaining of right second toe pain after an injury.  Patient states that he was standing from bed and tripped over a pull-up bar on the floor which been his second toe backwards.  He states that he has not been able to bend his toe since the accident and he noticed immediately swelling.  He has pain when bearing weight, but rates it a 1/10 if he is not putting pressure on the foot.  Denies other injury, hitting his head, or loss of consciousness.         Home Medications Prior to Admission medications   Medication Sig Start Date End Date Taking? Authorizing Provider  bacitracin ointment Apply 1 application topically 2 (two) times daily. 08/20/19   Antony Madura, PA-C  diphenhydrAMINE-PE-APAP (RA SEVERE ALLERGY PLUS SINUS) 25-5-325 MG TABS Take 1 tablet by mouth every 6 (six) hours as needed. Sinus headache    [provider]  ibuprofen (ADVIL,MOTRIN) 200 MG tablet Take 800 mg by mouth every 6 (six) hours as needed for moderate pain.     [provider]  oxyCODONE-acetaminophen (PERCOCET/ROXICET) 5-325 MG per tablet Take 2 tablets by mouth every 4 (four) hours as needed for pain. Patient not taking: Reported on 06/04/2018 11/01/12   Marisa Severin, MD      Allergies    Patient has no known allergies.    Review of Systems   Review of Systems  Musculoskeletal:  Positive for arthralgias (R 2nd toe) and joint swelling (R 2nd toe).    Physical Exam Updated Vital Signs BP (!) 146/85   Pulse 79   Temp 98.2 F (36.8 C) (Oral)   Resp 18   Ht 6\' 5"  (1.956 m)   Wt 131.5 kg   SpO2 100%   BMI 34.39 kg/m  Physical Exam Vitals and nursing note reviewed.  Constitutional:      General: He is not in acute distress.    Appearance:  Normal appearance. He is not ill-appearing or diaphoretic.  Cardiovascular:     Rate and Rhythm: Normal rate and regular rhythm.     Pulses:          Dorsalis pedis pulses are 2+ on the right side.  Pulmonary:     Effort: Pulmonary effort is normal.  Musculoskeletal:     Right foot: Decreased range of motion (Second toe). Normal capillary refill. Swelling (Second toe), tenderness (2nd toe) and bony tenderness (2nd toe) present. No deformity. Normal pulse.     Comments: Minor ecchymosis over right second toe.  Decreased active ROM but intact passive ROM.    Neurological:     Mental Status: He is alert. Mental status is at baseline.  Psychiatric:        Mood and Affect: Mood normal.        Behavior: Behavior normal.     ED Results / Procedures / Treatments   Labs (all labs ordered are listed, but only abnormal results are displayed) Labs Reviewed - No data to display  EKG None  Radiology DG Foot Complete Right  Result Date: 01/10/2023 CLINICAL DATA:  Right foot injury EXAM: RIGHT FOOT COMPLETE - 3+ VIEW COMPARISON:  None Available. FINDINGS: There is an acute oblique extra-articular fracture  of the mid diaphysis of the right second proximal phalanx with fracture fragments in near anatomic alignment. No other fracture identified. No dislocation. Mild surrounding soft tissue swelling. IMPRESSION: 1. Acute oblique extra-articular fracture of the right second proximal phalanx. Electronically Signed   By: Helyn Numbers M.D.   On: 01/10/2023 20:28    Procedures Procedures    Medications Ordered in ED Medications - No data to display  ED Course/ Medical Decision Making/ A&P                             Medical Decision Making Amount and/or Complexity of Data Reviewed Radiology: ordered.   This patient presents to the ED with chief complaint(s) of right second toe pain with noncontributory past medical history.  The complaint involves an extensive differential diagnosis and also  carries with it a high risk of complications and morbidity.    The differential diagnosis includes acute fracture or dislocation of toe, contusion, musculoskeletal strain or sprain  Initial Assessment:   Exam significant for swelling and ecchymosis over the right 2nd toe without obvious gross deformity.  Sensation is intact.  Capillary refill is normal.  Patient has very little active range of motion, but has intact passive range of motion with increased in pain.  DP pulse 2+.  Independent Interpretation of Imaging: I ordered and personally interpreted patient's right foot x-ray which showed an acute fracture of the right second proximal phalanx.    Treatment and Reassessment: Patient's toe was buddy taped and he was given a postop shoe to help offer additional support.  Discussed with patient that these fractures will typically heal on their own in 4 to 6 weeks.    Disposition:   Will provide patient orthopedic follow-up information should his symptoms not begin to improve with time and treatment.  Discussed supportive care measures for toe fracture with patient including the RICE method, ibuprofen, buddy taping, and use of the postop shoe.  The patient has been appropriately medically screened and/or stabilized in the ED. I have low suspicion for any other emergent medical condition which would require further screening, evaluation or treatment in the ED or require inpatient management. At time of discharge the patient is hemodynamically stable and in no acute distress. I have discussed work-up results and diagnosis with patient and answered all questions. Patient is agreeable with discharge plan. We discussed strict return precautions for returning to the emergency department and they verbalized understanding.           Final Clinical Impression(s) / ED Diagnoses Final diagnoses:  Closed nondisplaced fracture of proximal phalanx of lesser toe of right foot, initial encounter    Rx / DC  Orders ED Discharge Orders     None         Lenard Simmer, PA-C 01/10/23 2112    Benjiman Core, MD 01/11/23 0020

## 2023-01-10 NOTE — Discharge Instructions (Signed)
Thank you for allowing me to be a part of your care today.   Your x-ray showed that you have a broken bone in your second toe on the right foot.  This is typically treated conservatively with buddy tape and a firm walking shoe.  These fractures will heal within 4-6 weeks.    I have provided orthopedic follow-up information should your pain and swelling not improve with time and treatment.  I recommend keeping your toes taped together for additional support as well as use the firm walking shoe that you were provided.  I recommend taking 600 to 800 mg of ibuprofen every 6-8 hours as needed for pain.  You may also want to rest, elevate, and ice the injury over the next couple of days.  I have attached information about toe fractures for you.
